# Patient Record
Sex: Female | Born: 1960 | Race: White | Hispanic: No | Smoking: Former smoker
Health system: Southern US, Community
[De-identification: ages and names within clinical notes are randomized; demographics above are authoritative.]

## PROBLEM LIST (undated history)

## (undated) DIAGNOSIS — E785 Hyperlipidemia, unspecified: Secondary | ICD-10-CM

## (undated) DIAGNOSIS — Z1589 Genetic susceptibility to other disease: Secondary | ICD-10-CM

## (undated) DIAGNOSIS — F419 Anxiety disorder, unspecified: Secondary | ICD-10-CM

## (undated) DIAGNOSIS — Z1509 Genetic susceptibility to other malignant neoplasm: Secondary | ICD-10-CM

## (undated) DIAGNOSIS — Z1501 Genetic susceptibility to malignant neoplasm of breast: Secondary | ICD-10-CM

## (undated) DIAGNOSIS — N3010 Interstitial cystitis (chronic) without hematuria: Secondary | ICD-10-CM

## (undated) DIAGNOSIS — M8580 Other specified disorders of bone density and structure, unspecified site: Secondary | ICD-10-CM

## (undated) DIAGNOSIS — Z8041 Family history of malignant neoplasm of ovary: Secondary | ICD-10-CM

## (undated) DIAGNOSIS — A6000 Herpesviral infection of urogenital system, unspecified: Secondary | ICD-10-CM

## (undated) DIAGNOSIS — R87629 Unspecified abnormal cytological findings in specimens from vagina: Secondary | ICD-10-CM

## (undated) HISTORY — DX: Genetic susceptibility to other disease: Z15.89

## (undated) HISTORY — DX: Anxiety disorder, unspecified: F41.9

## (undated) HISTORY — DX: Genetic susceptibility to malignant neoplasm of breast: Z15.09

## (undated) HISTORY — PX: CARPAL TUNNEL RELEASE: SHX101

## (undated) HISTORY — DX: Genetic susceptibility to malignant neoplasm of breast: Z15.01

## (undated) HISTORY — DX: Family history of malignant neoplasm of ovary: Z80.41

## (undated) HISTORY — DX: Interstitial cystitis (chronic) without hematuria: N30.10

## (undated) HISTORY — DX: Hyperlipidemia, unspecified: E78.5

## (undated) HISTORY — PX: MASTECTOMY: SHX3

## (undated) HISTORY — PX: CRYOTHERAPY: SHX1416

## (undated) HISTORY — DX: Unspecified abnormal cytological findings in specimens from vagina: R87.629

## (undated) HISTORY — DX: Other specified disorders of bone density and structure, unspecified site: M85.80

## (undated) HISTORY — DX: Herpesviral infection of urogenital system, unspecified: A60.00

---

## 1993-05-14 HISTORY — PX: DILATION AND CURETTAGE, DIAGNOSTIC / THERAPEUTIC: SUR384

## 1998-05-14 HISTORY — PX: TUBAL LIGATION: SHX77

## 2001-02-27 HISTORY — PX: ABDOMINAL HYSTERECTOMY: SHX81

## 2005-05-13 ENCOUNTER — Emergency Department (HOSPITAL_COMMUNITY): Admission: EM | Admit: 2005-05-13 | Discharge: 2005-05-14 | Payer: Self-pay | Admitting: Emergency Medicine

## 2005-07-06 ENCOUNTER — Ambulatory Visit (HOSPITAL_COMMUNITY): Admission: RE | Admit: 2005-07-06 | Discharge: 2005-07-06 | Payer: Self-pay | Admitting: Family Medicine

## 2008-11-11 DIAGNOSIS — R87629 Unspecified abnormal cytological findings in specimens from vagina: Secondary | ICD-10-CM

## 2008-11-11 HISTORY — DX: Unspecified abnormal cytological findings in specimens from vagina: R87.629

## 2009-06-11 ENCOUNTER — Ambulatory Visit: Payer: Self-pay | Admitting: Gynecologic Oncology

## 2009-07-07 ENCOUNTER — Ambulatory Visit: Payer: Self-pay | Admitting: Gynecologic Oncology

## 2009-07-12 ENCOUNTER — Ambulatory Visit: Payer: Self-pay | Admitting: Gynecologic Oncology

## 2009-07-20 ENCOUNTER — Ambulatory Visit: Payer: Self-pay | Admitting: Gynecologic Oncology

## 2009-08-12 ENCOUNTER — Ambulatory Visit: Payer: Self-pay | Admitting: Gynecologic Oncology

## 2011-03-18 ENCOUNTER — Emergency Department: Payer: Self-pay | Admitting: Emergency Medicine

## 2012-12-12 HISTORY — PX: LAPAROSCOPIC OOPHERECTOMY: SHX6507

## 2013-02-20 ENCOUNTER — Ambulatory Visit: Payer: Self-pay | Admitting: Obstetrics & Gynecology

## 2013-02-20 LAB — CBC
HGB: 13.1 g/dL (ref 12.0–16.0)
RBC: 3.85 10*6/uL (ref 3.80–5.20)

## 2013-03-05 ENCOUNTER — Ambulatory Visit: Payer: Self-pay | Admitting: Obstetrics & Gynecology

## 2013-03-25 ENCOUNTER — Ambulatory Visit: Payer: Self-pay | Admitting: Oncology

## 2013-03-28 ENCOUNTER — Other Ambulatory Visit: Payer: Self-pay | Admitting: Oncology

## 2013-03-28 DIAGNOSIS — Z1501 Genetic susceptibility to malignant neoplasm of breast: Secondary | ICD-10-CM

## 2013-04-11 ENCOUNTER — Ambulatory Visit: Payer: Self-pay | Admitting: Oncology

## 2013-04-22 ENCOUNTER — Other Ambulatory Visit: Payer: Self-pay

## 2013-04-29 ENCOUNTER — Ambulatory Visit
Admission: RE | Admit: 2013-04-29 | Discharge: 2013-04-29 | Disposition: A | Discharge status: Home / Self Care | Payer: Managed Care, Other (non HMO) | Source: Ambulatory Visit | Attending: Oncology | Admitting: Oncology

## 2013-04-29 DIAGNOSIS — Z1501 Genetic susceptibility to malignant neoplasm of breast: Secondary | ICD-10-CM

## 2013-04-29 DIAGNOSIS — Z1509 Genetic susceptibility to other malignant neoplasm: Secondary | ICD-10-CM

## 2013-04-29 MED ORDER — GADOBENATE DIMEGLUMINE 529 MG/ML IV SOLN
11.0000 mL | Freq: Once | INTRAVENOUS | Status: AC | PRN
  Administered 2013-04-29: 11 mL via INTRAVENOUS

## 2013-06-11 ENCOUNTER — Ambulatory Visit: Payer: Self-pay | Admitting: Oncology

## 2014-09-19 ENCOUNTER — Other Ambulatory Visit: Payer: Self-pay

## 2014-09-19 DIAGNOSIS — Z1231 Encounter for screening mammogram for malignant neoplasm of breast: Secondary | ICD-10-CM

## 2014-09-23 ENCOUNTER — Ambulatory Visit
Admission: RE | Admit: 2014-09-23 | Discharge: 2014-09-23 | Disposition: A | Discharge status: Home / Self Care | Payer: No Typology Code available for payment source | Source: Ambulatory Visit

## 2014-09-23 DIAGNOSIS — Z1231 Encounter for screening mammogram for malignant neoplasm of breast: Secondary | ICD-10-CM

## 2014-09-25 ENCOUNTER — Other Ambulatory Visit: Payer: Self-pay | Admitting: Unknown Physician Specialty

## 2014-09-25 DIAGNOSIS — R928 Other abnormal and inconclusive findings on diagnostic imaging of breast: Secondary | ICD-10-CM

## 2014-10-01 ENCOUNTER — Ambulatory Visit
Admission: RE | Admit: 2014-10-01 | Discharge: 2014-10-01 | Disposition: A | Discharge status: Home / Self Care | Payer: No Typology Code available for payment source | Source: Ambulatory Visit | Attending: Unknown Physician Specialty | Admitting: Unknown Physician Specialty

## 2014-10-01 DIAGNOSIS — R928 Other abnormal and inconclusive findings on diagnostic imaging of breast: Secondary | ICD-10-CM

## 2015-04-03 NOTE — Op Note (Signed)
PATIENT NAME:  Candice Henderson, Candice Henderson MR#:  [id number] DATE OF BIRTH:  Oct 18, 1961  DATE OF PROCEDURE:  03/05/2013  PREOPERATIVE DIAGNOSIS: Family history for breast and ovarian cancer, BRCA positive status.  POSTOPERATIVE DIAGNOSIS: Family history for breast and ovarian cancer, BRCA positive status.   PROCEDURE PERFORMED: Operative laparoscopy and right salpingo-oophorectomy.   SURGEON: Barnett Applebaum, MD.   ANESTHESIA: General.   ESTIMATED BLOOD LOSS: Minimal.   COMPLICATIONS: None.   FINDINGS: Atrophic right tube and ovary clearly visualized. There were adhesions of the omentum and colon to the left lower quadrant, but none to the port entry sites or the right adnexa.   DISPOSITION: To the recovery room in stable condition.   TECHNIQUE: The patient is prepped and draped in the usual sterile fashion after adequate anesthesia is obtained in the dorsal lithotomy position. Bladder is drained with a Robinson catheter.   Attention was turned to the abdomen, where a Veress needle inserted through a 5 mm infraumbilical incision after Marcaine is used to anesthetize the skin. Veress needle placement is confirmed using the hanging drop technique and the abdomen is then insufflated with CO2 gas. A 5 mm trocar is then inserted under direct visualization with the laparoscope with no injuries or bleeding noted. The patient is placed in Trendelenburg position and the above-mentioned findings are visualized. A 5 mm trocar is placed in the left lower quadrant lateral to the inferior epigastric blood vessels with no injuries or bleeding noted. An 11 mm trocar is placed in the right lower quadrant lateral to the inferior epigastric blood vessels with no injuries or bleeding noted. Adhesions are dissected in the midline anterior abdominal wall towards the bladder. They are observed in the left lower quadrant, but are not dissected in this area as they are not causing any harm. The right tube and ovary is clearly  visualized. The ureter is also visualized out of harms way. The infundibulopelvic blood vessels and ligaments are carefully coagulated and cut using the 5 mm Harmonic scalpel device and then the ovaries then carefully dissected free off of the sidewall along with the fallopian tube. There are no injuries or bleeding noted. Ureters are intact. Bladder is intact. Bowel is intact. An Endopouch is placed through the trocar and the ovary and tube was placed within the Endopouch and removed easily. No bleeding was noted within the pelvis. Gas is expelled. The patient is leveled. Trocars are removed. The skin is closed with Dermabond. The patient goes to the recovery room in stable condition. All sponge, instrument, and needle counts are correct.   ____________________________ R. Barnett Applebaum, MD rph:aw D: 03/05/2013 12:31:58 ET T: 03/05/2013 12:43:51 ET JOB#: [id number]  cc: Glean Salen, MD, <Dictator> Gae Dry MD ELECTRONICALLY SIGNED 03/05/2013 14:07

## 2015-04-03 NOTE — Consult Note (Signed)
 Note Type Consult   HPI: Referred by Dr. Harris.   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 HPI:   Patient is a 54-year-old female with no significant past medical history that was found to be BRCA2 positive putting her at high risk for breast cancer.  She has had a total hysterectomy.  Currently, she feels well and is asymptomatic.  She denies any recent fevers or illnesses.  She has a good appetite and denies weight loss.  Recent mammogram in January was reported as normal.  Patient feels at her baseline and offers no specific complaints today.   Review of Systems:  Performance Status (ECOG): 0  Review of Systems:   As per HPI. Otherwise, 10 point system review was negative.   Allergies:  Codeine: Dizzy/Fainting, Rash, N/V/Diarrhea  Macrobid: Rash  Preventive Screening:  Has patient had any of the following test? Mammography  Pap Smear (1)   Last Mammography: 01/2013(1)   Last Pap Smear: 09/2012(1)   Smoking History: Smoking History 1/2(1)Packs per day(1)  PFSH: Additional Past Medical and Surgical History: total hysterectomy.    Family history: Paternal aunt with breast cancer,  maternal grandmother with ovarian cancer.   Paternal grandfather with prostate cancer.    Social history: Tobacco as above, denies alcohol.   Home Medications: Medication Instructions Last Modified Date/Time  tamoxifen 20 mg oral tablet 1 tab(s) orally once a day x 90 days 17-Apr-14 10:03  clonazepam 0.5 mg oral tablet 1 tab(s) orally 2 times a day 17-Apr-14 09:22  citalopram 20 mg oral tablet 1 tab(s) orally once a day (at bedtime) 17-Apr-14 09:22  pravastatin 20 mg oral tablet 1 tab(s) orally once a day (at bedtime) 17-Apr-14 09:22  Percocet 5/325 325 mg-5 mg oral tablet 1 tab(s) orally every 4-6 hours as needed for pain 17-Apr-14 09:22  estradiol 1 mg oral  tablet 1 tab(s) orally once a day 17-Apr-14 09:22   Vital Signs:  :: Ht(CM): 167 Wt(KG): 57.5 BSA: 1.6 Temp: 98.1 Pulse: 60 RR: 16  BP: 101/65   Physical Exam:  General: well developed, well nourished, and in no acute distress  Mental Status: normal affect  Eyes: anicteric sclera  Skin: No rash or petechiae noted  Neurological: alert, answering all questions appropriately.  Cranial nerves grossly intact   Assessment and Plan: Impression:   BRCA 2 positive Plan:   1.  BRCA 2 positive:  After lengthy discussion with the patient, she was informed her that she has an 84% chance of developing breast cancer over the next 20 years.  She has declined any surgical intervention which would include bilateral mastectomy with immediate reconstruction.  She also declined consultation with surgery to further discuss the details. She wishes to proceed with tamoxifen prophylaxis and yearly breast MRIs.  Patient was given a prescription for 20 mg tamoxifen with refills to last for one year, at that point either her primary care or OB/GYN should continue refills for a total of 5 years. There is no evidence that continuing tamoxifen beyond 5 years offers any benefit.  The breast MRI was also ordered and this will be required yearly along with yearly mammograms as well as breast examination every 6 months.  Patient expressed understanding and was in agreement with this plan. 60 minutes was spent in discussion and consultation.  CC Referral:  cc: Dr. Van Dalen, Dr. Angel Brown.     Electronic Signatures: Delight Hoh (MD)  (Signed 18-Apr-14 10:13)  Authored: Note Type, History of Present Illness, CC/HPI, Review of Systems, ALLERGIES, Preventive Screening, Smoking Cessation, Patient Family Social History, HOME MEDICATIONS, Vital Signs, Physical Exam, Assessment and Plan, CC Referring Physician   Last Updated: 18-Apr-14 10:13 by Delight Hoh (MD)  References: 1.  Data Referenced From "[facility]  Office Nurse Note" 28-Mar-2013 9:16 AM

## 2015-08-13 HISTORY — PX: COLONOSCOPY: SHX174

## 2015-09-21 ENCOUNTER — Encounter: Payer: Self-pay | Admitting: *Deleted

## 2015-10-05 ENCOUNTER — Encounter: Payer: Self-pay | Admitting: General Surgery

## 2015-10-05 ENCOUNTER — Ambulatory Visit (INDEPENDENT_AMBULATORY_CARE_PROVIDER_SITE_OTHER): Payer: 59 | Admitting: General Surgery

## 2015-10-05 VITALS — BP 102/70 | HR 68 | Resp 13 | Ht 64.5 in | Wt 132.0 lb

## 2015-10-05 DIAGNOSIS — Z1509 Genetic susceptibility to other malignant neoplasm: Principal | ICD-10-CM

## 2015-10-05 DIAGNOSIS — Z1501 Genetic susceptibility to malignant neoplasm of breast: Secondary | ICD-10-CM | POA: Diagnosis not present

## 2015-10-05 NOTE — Progress Notes (Signed)
Patient ID: Candice Henderson, female   DOB: 11-20-1961, 54 y.o.   MRN: [id number]  Chief Complaint  Patient presents with  . BRCA positive    HPI Candice Henderson is a 54 y.o. female here for surgical evaluation for BRCA 2 positive. She has a strong family history of ovarian cancer prompting testing.  She has a history of cervical dysplasia. She had BRCA testing done about 3 years ago.   She reports no breast problems. Her most recent mammogram was done on 09/18/15 at [facility]. She had a breast MRI on 04/29/13.  The patient had previously undergone a hysterectomy and unilateral ovary removal. With the identification of the BRCA-2 abnormality, she underwent removal of the remaining ovary.  The patient has been on tamoxifen therapy since discovery of the BRCA-2 gene expression, and tolerated it well.  She has a twin (fraternal) sister with breast cancer that was diagnosed last year and was also BRCA positive. This individual was treated with high lateral mastectomy and underwent immediate reconstruction. Apparently this was a fairly traumatic procedure for the patient as well as for her sister.  She reports another friend to it undergone cosmetic breast augmentation had some difficulties with the procedure and at this time she is very ambivalent about whether she would undergo any reconstructive procedure should she have prophylactic mastectomies completed.  I personally confirmed the above history during the patient's visit. Marland Kitchen HPI  Past Medical History  Diagnosis Date  . Hyperlipidemia   . BRCA2 positive     Past Surgical History  Procedure Laterality Date  . Cesarean section    . Carpal tunnel release  15 years ago    Thumb joint reconstruction  . Tubal ligation    . Abdominal hysterectomy    . Laparoscopic oopherectomy      Family History  Problem Relation Age of Onset  . Breast cancer Sister     stage 2, triple neg, BRCA pos  . Lung cancer Father   . Liver cancer Mother      Social History Social History  Substance Use Topics  . Smoking status: Current Every Day Smoker -- 0.25 packs/day for 30 years    Types: Cigarettes  . Smokeless tobacco: Never Used  . Alcohol Use: 0.0 oz/week    0 Standard drinks or equivalent per week    Allergies  Allergen Reactions  . Codeine Nausea And Vomiting and Rash  . Macrobid [Nitrofurantoin] Nausea And Vomiting and Rash    Current Outpatient Prescriptions  Medication Sig Dispense Refill  . citalopram (CELEXA) 20 MG tablet TK 1 T PO  DAILY  1  . clonazePAM (KLONOPIN) 1 MG tablet TK 1 T PO BID PRA  0  . pravastatin (PRAVACHOL) 40 MG tablet TK 1 T PO Q EVENING  1  . tamoxifen (NOLVADEX) 20 MG tablet   1   No current facility-administered medications for this visit.    Review of Systems Review of Systems  Constitutional: Negative.   Respiratory: Negative.   Cardiovascular: Negative.     Blood pressure 102/70, pulse 68, resp. rate 13, height 5' 4.5" (1.638 m), weight 132 lb (59.875 kg).  Physical Exam Physical Exam  Constitutional: She is oriented to person, place, and time. She appears well-developed and well-nourished.  Eyes: Conjunctivae are normal. No scleral icterus.  Neck: Neck supple.  Cardiovascular: Normal rate, regular rhythm and normal heart sounds.   Pulmonary/Chest: Effort normal and breath sounds normal. Right breast exhibits no inverted nipple, no mass,  no nipple discharge, no skin change and no tenderness. Left breast exhibits no inverted nipple, no mass, no nipple discharge, no skin change and no tenderness.  Lymphadenopathy:    She has no cervical adenopathy.    She has no axillary adenopathy.  Neurological: She is alert and oriented to person, place, and time.  Skin: Skin is warm and dry.  Psychiatric: She has a normal mood and affect.    Data Reviewed Screening mammogram dated 09/17/2005 was available only on written report. Heterogeneously dense breast, no interval change.  BI-RADS-1.  The patient reports bone density testing completed fall 2016 showed osteopenia of the hip and spine. These results were not available for review.  BRCA analysis dated 08/29/2012 showed normal BRCA1 sequencing, abnormal BRCA2 sequencing, the patient was shown to have a mutation in the [id number], with a possible increased risk of breast cancer 84%.  Office notes from Bunnie Pion, dated 09/18/2015 were reviewed. Dr. Ammie Dalton made note that the patient would definitely not consider reconstruction.  Office notes from Zara Chess, NP dated 08/25/2015 were reviewed. Laboratory studies obtained during that visit showed a hemoglobin of 12.6 with an MCV of 82, platelet count of 165,000. Normal electrolytes. Random blood sugar elevated at 105. Creatinine 0.7. Estimated GFR greater than 60. Low-normal vitamin D. Osteopenia noted with a T score of -1.3. Study dated 08/27/2015.  Chest x-ray dated 03/31/2015 for chest wall pain was negative.  Assessment    Healthy woman with BRCA-2 abnormality.  Family history of breast cancer.    Plan    The patient has tolerated tamoxifen fairly well, but hot flashes/vasomotor symptoms are noted in her PCP records.  Her risk for breast cancer is likely between 50-80% based on her family history and BRCA status.  The less than ideal results/experience with breast reconstruction from both her sister reconstructed after cancer and a friend who had cosmetic augmentation have discouraged her from consideration of reconstruction after prophylactic mastectomy.  We spent about 15 minutes reviewing pros and cons of various surgical approaches. I have recommended that a MRI be completed prior to prophylactic mastectomy. If a malignancy was identified this would allow sentinel node biopsy at the time of mastectomy.    Patient to have a bilateral breast MRI at the [facility] [place].  Once the MRI results are available arrangements can be made  to discuss elective surgery.  PCP: Rosalie Gums 10/07/2015, 6:14 PM

## 2015-10-05 NOTE — Patient Instructions (Signed)
Patient to call and arrange for a bilateral breast MRI [facility].

## 2015-10-07 DIAGNOSIS — Z1501 Genetic susceptibility to malignant neoplasm of breast: Secondary | ICD-10-CM | POA: Insufficient documentation

## 2015-10-07 DIAGNOSIS — Z1509 Genetic susceptibility to other malignant neoplasm: Principal | ICD-10-CM

## 2015-10-20 ENCOUNTER — Ambulatory Visit
Admission: RE | Admit: 2015-10-20 | Discharge: 2015-10-20 | Disposition: A | Discharge status: Home / Self Care | Payer: 59 | Source: Ambulatory Visit | Attending: General Surgery | Admitting: General Surgery

## 2015-10-20 ENCOUNTER — Telehealth: Payer: Self-pay | Admitting: General Surgery

## 2015-10-20 DIAGNOSIS — Z1509 Genetic susceptibility to other malignant neoplasm: Principal | ICD-10-CM

## 2015-10-20 DIAGNOSIS — Z1501 Genetic susceptibility to malignant neoplasm of breast: Secondary | ICD-10-CM

## 2015-10-20 MED ORDER — GADOBENATE DIMEGLUMINE 529 MG/ML IV SOLN
12.0000 mL | Freq: Once | INTRAVENOUS | Status: AC | PRN
  Administered 2015-10-20: 12 mL via INTRAVENOUS

## 2015-10-20 NOTE — Telephone Encounter (Signed)
The patient was notified that the MRI completed earlier in the day did not show any areas suspicious for malignancy.  She is leaning towards prophylactic mastectomy, but is aware at this time there is nothing forcing the decision.  The patient will consider her options (ongoing tamoxifen, risks associated with DVT/smoking) versus prophylactic mastectomy and notify this office of how she would like to proceed.

## 2015-10-23 ENCOUNTER — Telehealth: Payer: Self-pay | Admitting: General Surgery

## 2015-10-23 NOTE — Telephone Encounter (Signed)
PT CALLED & WOULD LIKE TO TALK WITH YOU FURTHER ABOUT HAVING A BILATERAL MASTECTOMY. SHE WOULD LIKE TO HAVE SURGERY BEFORE THE END OF YEAR DUE TO INSURANCE.

## 2015-10-23 NOTE — Telephone Encounter (Signed)
Candice Henderson, I SPOKE WITH PT & HAVE SCH'D HER APPT WITH DR BYRNETT FOR SX DISCUSSION ON 11-04-15 @ 11:45AM. HOWEVER,I WAS UNABLE TO BLOCK 45/60 MINS OFF. THE  PT DOESN'T WANT TO COME IN THE PM FOR APPT. IF I NEED TO RE-SCH HER APPT  PLEASE LET ME KNOW.

## 2015-10-23 NOTE — Telephone Encounter (Signed)
Arrange f/u appt to discuss. Candice Henderson will contact her Monday to look at dates.

## 2015-10-26 ENCOUNTER — Other Ambulatory Visit: Payer: Self-pay | Admitting: General Surgery

## 2015-10-26 DIAGNOSIS — Z1509 Genetic susceptibility to other malignant neoplasm: Principal | ICD-10-CM

## 2015-10-26 DIAGNOSIS — Z1501 Genetic susceptibility to malignant neoplasm of breast: Secondary | ICD-10-CM

## 2015-10-26 NOTE — Telephone Encounter (Signed)
Spoke with patient and she would like to set her surgery up for 11/20/15. She will be coming into the office on 11/04/15 at 11:45 am to discuss this with Dr Lemar LivingsByrnett.

## 2015-11-04 ENCOUNTER — Encounter: Payer: Self-pay | Admitting: General Surgery

## 2015-11-04 ENCOUNTER — Ambulatory Visit (INDEPENDENT_AMBULATORY_CARE_PROVIDER_SITE_OTHER): Payer: 59 | Admitting: General Surgery

## 2015-11-04 VITALS — BP 94/62 | HR 82 | Resp 14 | Ht 64.5 in | Wt 133.0 lb

## 2015-11-04 DIAGNOSIS — Z1501 Genetic susceptibility to malignant neoplasm of breast: Secondary | ICD-10-CM

## 2015-11-04 DIAGNOSIS — Z1509 Genetic susceptibility to other malignant neoplasm: Principal | ICD-10-CM

## 2015-11-04 NOTE — Progress Notes (Signed)
Patient ID: Candice Henderson, female   DOB: [birth year], 54 y.o.   MRN: [id number]  Chief Complaint  Patient presents with  . Other    Discussion for surgery    HPI Candice Henderson is a 54 y.o. female here today for a pre-op discussion for her surgery, prophylactic bilateral mastectomy, scheduled for 11/20/15.   HPI  Past Medical History  Diagnosis Date  . Hyperlipidemia   . BRCA2 positive     Past Surgical History  Procedure Laterality Date  . Cesarean section    . Carpal tunnel release  15 years ago    Thumb joint reconstruction  . Tubal ligation    . Abdominal hysterectomy    . Laparoscopic oopherectomy      Family History  Problem Relation Age of Onset  . Breast cancer Sister     stage 2, triple neg, BRCA pos  . Lung cancer Father   . Liver cancer Mother     Social History Social History  Substance Use Topics  . Smoking status: Former Smoker -- 0.25 packs/day for 30 years    Types: Cigarettes    Quit date: 10/26/2015  . Smokeless tobacco: Never Used  . Alcohol Use: 0.0 oz/week    0 Standard drinks or equivalent per week    Allergies  Allergen Reactions  . Codeine Nausea And Vomiting and Rash  . Macrobid [Nitrofurantoin] Nausea And Vomiting and Rash    Current Outpatient Prescriptions  Medication Sig Dispense Refill  . citalopram (CELEXA) 20 MG tablet TK 1 T PO  DAILY  1  . clonazePAM (KLONOPIN) 1 MG tablet TK 1 T PO BID PRA  0  . pravastatin (PRAVACHOL) 40 MG tablet TK 1 T PO Q EVENING  1   No current facility-administered medications for this visit.    Review of Systems Review of Systems  Constitutional: Negative.   Respiratory: Negative.   Cardiovascular: Negative.     Blood pressure 94/62, pulse 82, resp. rate 14, height 5' 4.5" (1.638 m), weight 133 lb (60.328 kg).  Physical Exam Physical Exam  Constitutional: She is oriented to person, place, and time. She appears well-developed and well-nourished.  HENT:  Mouth/Throat: Oropharynx is  clear and moist.  Eyes: Conjunctivae are normal. No scleral icterus.  Neck: Neck supple.  Cardiovascular: Normal rate, regular rhythm and normal heart sounds.   Pulmonary/Chest: Effort normal and breath sounds normal.  Lymphadenopathy:    She has no cervical adenopathy.  Neurological: She is alert and oriented to person, place, and time.  Skin: Skin is warm and dry.  Psychiatric: Her behavior is normal.      Assessment    BRCA-2 positive, desires prophylactic bilateral mastectomy.    Plan    We went over the pros and cons of mastectomy with or without immediate reconstruction. She is wavered considerably on reconstruction, in part based on her sister's traumas, especially pain with the expander. I emphasized that if she has any desire for immediate reconstruction, now is the time to meet with plastics, she declines.  Drains, activity restrictions and return to full activity reviewed.    Patient's surgery has been scheduled for 11-20-15 at [facility].  PCP: Rosalie Gums 11/05/2015, 7:05 PM

## 2015-11-04 NOTE — Patient Instructions (Addendum)
The patient is aware to call back for any questions or concerns.  Patient's surgery has been scheduled for 11-20-15 at [facility]. 

## 2015-11-12 ENCOUNTER — Other Ambulatory Visit: Payer: 59

## 2015-11-12 ENCOUNTER — Encounter: Payer: Self-pay | Admitting: *Deleted

## 2015-11-12 NOTE — Patient Instructions (Signed)
  Your procedure is scheduled on: 11-20-15 Report to [facility] 2ND FLOOR To find out your arrival time please call [phone] between 1PM - 3PM on 11-19-15  Remember: Instructions that are not followed completely may result in serious medical risk, up to and including death, or upon the discretion of your surgeon and anesthesiologist your surgery may need to be rescheduled.    _X___ 1. Do not eat food or drink liquids after midnight. No gum chewing or hard candies.     _X___ 2. No Alcohol for 24 hours before or after surgery.   ____ 3. Bring all medications with you on the day of surgery if instructed.    ____ 4. Notify your doctor if there is any change in your medical condition     (cold, fever, infections).     Do not wear jewelry, make-up, hairpins, clips or nail polish.  Do not wear lotions, powders, or perfumes. You may wear deodorant.  Do not shave 48 hours prior to surgery. Men may shave face and neck.  Do not bring valuables to the hospital.    [facility] is not responsible for any belongings or valuables.               Contacts, dentures or bridgework may not be worn into surgery.  Leave your suitcase in the car. After surgery it may be brought to your room.  For patients admitted to the hospital, discharge time is determined by your                treatment team.   Patients discharged the day of surgery will not be allowed to drive home.   Please read over the following fact sheets that you were given:      _X___ Take these medicines the morning of surgery with A SIP OF WATER:    1. KLONONPIN  2. CITALOPRAM  3.   4.  5.  6.  ____ Fleet Enema (as directed)   ____ Use CHG Soap as directed  ____ Use inhalers on the day of surgery  ____ Stop metformin 2 days prior to surgery    ____ Take 1/2 of usual insulin dose the night before surgery and none on the morning of surgery.   ____ Stop Coumadin/Plavix/aspirin-N/A  ____ Stop  Anti-inflammatories   ____ Stop supplements until after surgery.    ____ Bring C-Pap to the hospital.

## 2015-11-19 MED ORDER — IPRATROPIUM-ALBUTEROL 0.5-2.5 (3) MG/3ML IN SOLN
RESPIRATORY_TRACT | Status: AC
  Filled 2015-11-19: qty 3

## 2015-11-19 MED ORDER — ALBUTEROL SULFATE (2.5 MG/3ML) 0.083% IN NEBU
INHALATION_SOLUTION | RESPIRATORY_TRACT | Status: AC
  Filled 2015-11-19: qty 3

## 2015-11-20 ENCOUNTER — Ambulatory Visit: Payer: 59 | Admitting: Anesthesiology

## 2015-11-20 ENCOUNTER — Encounter
Admission: RE | Disposition: A | Discharge status: Home / Self Care | Payer: Self-pay | Source: Ambulatory Visit | Attending: General Surgery

## 2015-11-20 ENCOUNTER — Ambulatory Visit
Admission: RE | Admit: 2015-11-20 | Discharge: 2015-11-20 | Disposition: A | Discharge status: Home / Self Care | Payer: 59 | Source: Ambulatory Visit | Attending: General Surgery | Admitting: General Surgery

## 2015-11-20 DIAGNOSIS — Z90710 Acquired absence of both cervix and uterus: Secondary | ICD-10-CM | POA: Insufficient documentation

## 2015-11-20 DIAGNOSIS — Z803 Family history of malignant neoplasm of breast: Secondary | ICD-10-CM | POA: Diagnosis not present

## 2015-11-20 DIAGNOSIS — Z9889 Other specified postprocedural states: Secondary | ICD-10-CM | POA: Diagnosis not present

## 2015-11-20 DIAGNOSIS — Z4001 Encounter for prophylactic removal of breast: Secondary | ICD-10-CM | POA: Diagnosis not present

## 2015-11-20 DIAGNOSIS — Z87891 Personal history of nicotine dependence: Secondary | ICD-10-CM | POA: Insufficient documentation

## 2015-11-20 DIAGNOSIS — Z885 Allergy status to narcotic agent status: Secondary | ICD-10-CM | POA: Insufficient documentation

## 2015-11-20 DIAGNOSIS — Z1501 Genetic susceptibility to malignant neoplasm of breast: Secondary | ICD-10-CM

## 2015-11-20 DIAGNOSIS — E785 Hyperlipidemia, unspecified: Secondary | ICD-10-CM | POA: Insufficient documentation

## 2015-11-20 DIAGNOSIS — Z881 Allergy status to other antibiotic agents status: Secondary | ICD-10-CM | POA: Diagnosis not present

## 2015-11-20 DIAGNOSIS — Z1509 Genetic susceptibility to other malignant neoplasm: Secondary | ICD-10-CM

## 2015-11-20 DIAGNOSIS — Z888 Allergy status to other drugs, medicaments and biological substances status: Secondary | ICD-10-CM | POA: Diagnosis not present

## 2015-11-20 HISTORY — PX: BREAST SURGERY: SHX581

## 2015-11-20 HISTORY — PX: SIMPLE MASTECTOMY WITH AXILLARY SENTINEL NODE BIOPSY: SHX6098

## 2015-11-20 SURGERY — SIMPLE MASTECTOMY
Anesthesia: General | Laterality: Bilateral | Wound class: Clean

## 2015-11-20 MED ORDER — ACETAMINOPHEN 10 MG/ML IV SOLN
INTRAVENOUS | Status: DC | PRN
Start: 2015-11-20 — End: 2015-11-20
  Administered 2015-11-20: 1000 mg via INTRAVENOUS

## 2015-11-20 MED ORDER — LIDOCAINE HCL (CARDIAC) 20 MG/ML IV SOLN
INTRAVENOUS | Status: DC | PRN
  Administered 2015-11-20: 80 mg via INTRAVENOUS

## 2015-11-20 MED ORDER — HYDROMORPHONE HCL 1 MG/ML IJ SOLN
INTRAMUSCULAR | Status: AC
  Administered 2015-11-20: 0.25 mg via INTRAVENOUS
  Filled 2015-11-20: qty 1

## 2015-11-20 MED ORDER — SODIUM CHLORIDE 0.9 % IJ SOLN
INTRAMUSCULAR | Status: AC
  Filled 2015-11-20: qty 10

## 2015-11-20 MED ORDER — FAMOTIDINE 20 MG PO TABS
ORAL_TABLET | ORAL | Status: AC
  Administered 2015-11-20: 20 mg
  Filled 2015-11-20: qty 1

## 2015-11-20 MED ORDER — OXYCODONE-ACETAMINOPHEN 5-325 MG PO TABS
1.0000 | ORAL_TABLET | ORAL | Status: DC | PRN
  Administered 2015-11-20: 1 via ORAL

## 2015-11-20 MED ORDER — GLYCOPYRROLATE 0.2 MG/ML IJ SOLN
INTRAMUSCULAR | Status: DC | PRN
  Administered 2015-11-20: 0.2 mg via INTRAVENOUS

## 2015-11-20 MED ORDER — CEFAZOLIN SODIUM 1-5 GM-% IV SOLN
INTRAVENOUS | Status: DC | PRN
  Administered 2015-11-20: 2 g via INTRAVENOUS

## 2015-11-20 MED ORDER — FAMOTIDINE 20 MG PO TABS: 20.0000 mg | ORAL_TABLET | Freq: Once | ORAL | Status: DC

## 2015-11-20 MED ORDER — CEFAZOLIN SODIUM-DEXTROSE 2-3 GM-% IV SOLR: 2.0000 g | INTRAVENOUS | Status: DC

## 2015-11-20 MED ORDER — OXYCODONE-ACETAMINOPHEN 5-325 MG PO TABS: 1.0000 | ORAL_TABLET | ORAL | Status: DC | PRN

## 2015-11-20 MED ORDER — EPHEDRINE SULFATE 50 MG/ML IJ SOLN
INTRAMUSCULAR | Status: DC | PRN
  Administered 2015-11-20: 10 mg via INTRAVENOUS

## 2015-11-20 MED ORDER — KETOROLAC TROMETHAMINE 30 MG/ML IJ SOLN
INTRAMUSCULAR | Status: DC | PRN
  Administered 2015-11-20: 30 mg via INTRAVENOUS

## 2015-11-20 MED ORDER — FENTANYL CITRATE (PF) 100 MCG/2ML IJ SOLN
INTRAMUSCULAR | Status: AC
  Administered 2015-11-20: 25 ug via INTRAVENOUS
  Filled 2015-11-20: qty 2

## 2015-11-20 MED ORDER — METHYLENE BLUE 1 % INJ SOLN
INTRAMUSCULAR | Status: AC
  Filled 2015-11-20: qty 10

## 2015-11-20 MED ORDER — DEXAMETHASONE SODIUM PHOSPHATE 4 MG/ML IJ SOLN
INTRAMUSCULAR | Status: DC | PRN
  Administered 2015-11-20: 10 mg via INTRAVENOUS

## 2015-11-20 MED ORDER — CEFAZOLIN SODIUM-DEXTROSE 2-3 GM-% IV SOLR
INTRAVENOUS | Status: AC
  Filled 2015-11-20: qty 50

## 2015-11-20 MED ORDER — ACETAMINOPHEN 10 MG/ML IV SOLN
INTRAVENOUS | Status: AC
  Filled 2015-11-20: qty 100

## 2015-11-20 MED ORDER — LACTATED RINGERS IV SOLN
INTRAVENOUS | Status: DC
  Administered 2015-11-20 (×2): via INTRAVENOUS

## 2015-11-20 MED ORDER — OXYCODONE-ACETAMINOPHEN 5-325 MG PO TABS
ORAL_TABLET | ORAL | Status: AC
  Administered 2015-11-20: 1 via ORAL
  Filled 2015-11-20: qty 1

## 2015-11-20 MED ORDER — MIDAZOLAM HCL 2 MG/2ML IJ SOLN
INTRAMUSCULAR | Status: DC | PRN
  Administered 2015-11-20: 2 mg via INTRAVENOUS

## 2015-11-20 MED ORDER — FENTANYL CITRATE (PF) 100 MCG/2ML IJ SOLN
25.0000 ug | INTRAMUSCULAR | Status: DC | PRN
  Administered 2015-11-20 (×4): 25 ug via INTRAVENOUS

## 2015-11-20 MED ORDER — PROPOFOL 10 MG/ML IV BOLUS
INTRAVENOUS | Status: DC | PRN
  Administered 2015-11-20: 450 mg via INTRAVENOUS

## 2015-11-20 MED ORDER — ONDANSETRON HCL 4 MG/2ML IJ SOLN: 4.0000 mg | Freq: Once | INTRAMUSCULAR | Status: DC | PRN

## 2015-11-20 MED ORDER — LIDOCAINE HCL (PF) 1 % IJ SOLN
INTRAMUSCULAR | Status: AC
  Filled 2015-11-20: qty 2

## 2015-11-20 MED ORDER — HYDROMORPHONE HCL 1 MG/ML IJ SOLN
0.2500 mg | INTRAMUSCULAR | Status: DC | PRN
  Administered 2015-11-20 (×3): 0.25 mg via INTRAVENOUS

## 2015-11-20 MED ORDER — ONDANSETRON HCL 4 MG/2ML IJ SOLN
INTRAMUSCULAR | Status: AC
  Filled 2015-11-20: qty 2

## 2015-11-20 MED ORDER — FENTANYL CITRATE (PF) 100 MCG/2ML IJ SOLN
INTRAMUSCULAR | Status: DC | PRN
  Administered 2015-11-20 (×5): 50 ug via INTRAVENOUS

## 2015-11-20 MED ORDER — ONDANSETRON HCL 4 MG/2ML IJ SOLN
INTRAMUSCULAR | Status: DC | PRN
  Administered 2015-11-20: 4 mg via INTRAVENOUS

## 2015-11-20 SURGICAL SUPPLY — 48 items
APPLIER CLIP 11 MED OPEN (CLIP)
APPLIER CLIP 13 LRG OPEN (CLIP)
BANDAGE ELASTIC 6 LF NS (GAUZE/BANDAGES/DRESSINGS) ×2 IMPLANT
BLADE SURG 15 STRL SS SAFETY (BLADE) ×2 IMPLANT
BNDG GAUZE 4.5X4.1 6PLY STRL (MISCELLANEOUS) ×2 IMPLANT
BULB RESERV EVAC DRAIN JP 100C (MISCELLANEOUS) ×4 IMPLANT
CANISTER SUCT 1200ML W/VALVE (MISCELLANEOUS) ×2 IMPLANT
CHLORAPREP W/TINT 26ML (MISCELLANEOUS) ×2 IMPLANT
CLIP APPLIE 11 MED OPEN (CLIP) IMPLANT
CLIP APPLIE 13 LRG OPEN (CLIP) IMPLANT
CNTNR SPEC 2.5X3XGRAD LEK (MISCELLANEOUS)
CONT SPEC 4OZ STER OR WHT (MISCELLANEOUS)
CONTAINER SPEC 2.5X3XGRAD LEK (MISCELLANEOUS) IMPLANT
DEVICE DUBIN SPECIMEN MAMMOGRA (MISCELLANEOUS) IMPLANT
DRAIN CHANNEL JP 15F RND 16 (MISCELLANEOUS) ×4 IMPLANT
DRAPE LAPAROTOMY TRNSV 106X77 (MISCELLANEOUS) ×2 IMPLANT
DRSG TELFA 3X8 NADH (GAUZE/BANDAGES/DRESSINGS) ×2 IMPLANT
ELECT CAUTERY BLADE TIP 2.5 (TIP) ×2
ELECTRODE CAUTERY BLDE TIP 2.5 (TIP) ×1 IMPLANT
GAUZE FLUFF 18X24 1PLY STRL (GAUZE/BANDAGES/DRESSINGS) ×4 IMPLANT
GAUZE SPONGE 4X4 12PLY STRL (GAUZE/BANDAGES/DRESSINGS) ×2 IMPLANT
GLOVE BIO SURGEON STRL SZ7.5 (GLOVE) ×4 IMPLANT
GLOVE INDICATOR 8.0 STRL GRN (GLOVE) ×4 IMPLANT
GOWN STRL REUS W/ TWL LRG LVL3 (GOWN DISPOSABLE) ×2 IMPLANT
GOWN STRL REUS W/TWL LRG LVL3 (GOWN DISPOSABLE) ×2
KIT RM TURNOVER STRD PROC AR (KITS) ×2 IMPLANT
LABEL OR SOLS (LABEL) ×2 IMPLANT
PACK BASIN MINOR ARMC (MISCELLANEOUS) ×2 IMPLANT
PAD GROUND ADULT SPLIT (MISCELLANEOUS) ×2 IMPLANT
PIN SAFETY STRL (MISCELLANEOUS) ×2 IMPLANT
SHEARS FOC LG CVD HARMONIC 17C (MISCELLANEOUS) IMPLANT
SLEVE PROBE SENORX GAMMA FIND (MISCELLANEOUS) IMPLANT
SPONGE LAP 18X18 5 PK (GAUZE/BANDAGES/DRESSINGS) ×2 IMPLANT
STRIP CLOSURE SKIN 1/2X4 (GAUZE/BANDAGES/DRESSINGS) ×4 IMPLANT
SUT ETHILON 3-0 FS-10 30 BLK (SUTURE) ×2
SUT SILK 0 (SUTURE) ×1
SUT SILK 0 30XBRD TIE 6 (SUTURE) ×1 IMPLANT
SUT SILK 3 0 (SUTURE) ×1
SUT SILK 3-0 18XBRD TIE 12 (SUTURE) ×1 IMPLANT
SUT VIC AB 2-0 CT1 27 (SUTURE) ×4
SUT VIC AB 2-0 CT1 TAPERPNT 27 (SUTURE) ×4 IMPLANT
SUT VIC AB 2-0 CT2 27 (SUTURE) ×4 IMPLANT
SUT VIC AB 3-0 SH 27 (SUTURE) ×1
SUT VIC AB 3-0 SH 27X BRD (SUTURE) ×1 IMPLANT
SUT VICRYL+ 3-0 144IN (SUTURE) ×2 IMPLANT
SUTURE EHLN 3-0 FS-10 30 BLK (SUTURE) ×1 IMPLANT
SWABSTK COMLB BENZOIN TINCTURE (MISCELLANEOUS) ×2 IMPLANT
TAPE TRANSPORE STRL 2 31045 (GAUZE/BANDAGES/DRESSINGS) ×2 IMPLANT

## 2015-11-20 NOTE — Progress Notes (Signed)
JP drain number1     30cc and JP drain two 15cc

## 2015-11-20 NOTE — Discharge Instructions (Signed)
AMBULATORY SURGERY  °DISCHARGE INSTRUCTIONS ° ° °1) The drugs that you were given will stay in your system until tomorrow so for the next 24 hours you should not: ° °A) Drive an automobile °B) Make any legal decisions °C) Drink any alcoholic beverage ° ° °2) You may resume regular meals tomorrow.  Today it is better to start with liquids and gradually work up to solid foods. ° °You may eat anything you prefer, but it is better to start with liquids, then soup and crackers, and gradually work up to solid foods. ° ° °3) Please notify your doctor immediately if you have any unusual bleeding, trouble breathing, redness and pain at the surgery site, drainage, fever, or pain not relieved by medication. ° ° ° °4) Additional Instructions: ° ° ° ° ° ° ° °Please contact your physician with any problems or Same Day Surgery at [phone], Monday through Friday 6 am to 4 pm, or [place] at [place] Main number at [phone].AMBULATORY SURGERY  °DISCHARGE INSTRUCTIONS ° ° °5) The drugs that you were given will stay in your system until tomorrow so for the next 24 hours you should not: ° °D) Drive an automobile °E) Make any legal decisions °F) Drink any alcoholic beverage ° ° °6) You may resume regular meals tomorrow.  Today it is better to start with liquids and gradually work up to solid foods. ° °You may eat anything you prefer, but it is better to start with liquids, then soup and crackers, and gradually work up to solid foods. ° ° °7) Please notify your doctor immediately if you have any unusual bleeding, trouble breathing, redness and pain at the surgery site, drainage, fever, or pain not relieved by medication. ° ° ° °8) Additional Instructions: ° ° ° ° ° ° ° °Please contact your physician with any problems or Same Day Surgery at [phone], Monday through Friday 6 am to 4 pm, or [place] at [place] Main number at [phone]. °

## 2015-11-20 NOTE — Anesthesia Preprocedure Evaluation (Signed)
Anesthesia Evaluation  Patient identified by MRN, date of birth, ID band Patient awake    Reviewed: Allergy & Precautions, H&P , NPO status , Patient's Chart, lab work & pertinent test results, reviewed documented beta blocker date and time   Airway Mallampati: II  TM Distance: >3 FB Neck ROM: full    Dental  (+) Teeth Intact   Pulmonary neg pulmonary ROS, former smoker,    Pulmonary exam normal        Cardiovascular Exercise Tolerance: Good negative cardio ROS Normal cardiovascular exam Rate:Normal     Neuro/Psych negative neurological ROS  negative psych ROS   GI/Hepatic negative GI ROS, Neg liver ROS,   Endo/Other  negative endocrine ROS  Renal/GU negative Renal ROS  negative genitourinary   Musculoskeletal   Abdominal   Peds  Hematology negative hematology ROS (+)   Anesthesia Other Findings   Reproductive/Obstetrics negative OB ROS                             Anesthesia Physical Anesthesia Plan  ASA: II  Anesthesia Plan: General LMA   Post-op Pain Management:    Induction:   Airway Management Planned:   Additional Equipment:   Intra-op Plan:   Post-operative Plan:   Informed Consent: I have reviewed the patients History and Physical, chart, labs and discussed the procedure including the risks, benefits and alternatives for the proposed anesthesia with the patient or authorized representative who has indicated his/her understanding and acceptance.     Plan Discussed with: CRNA  Anesthesia Plan Comments:         Anesthesia Quick Evaluation  

## 2015-11-20 NOTE — Op Note (Signed)
Preoperative diagnosis: BRCA positive.  Postoperative diagnosis: Same.  Operative procedure: Bilateral simple mastectomy.  Operating surgeon: Ollen Bowl, M.D.  Anesthesia: Gen. by LMA.  Estimated blood loss: 30 mL.  Clinical note: This 54 year old woman has been diagnosed as being BRCA-2 positive. Her twins Sr. recently had bilateral breast cancer. Desired prophylactic mastectomy.  Operative note: The patient received Kefzol prior to procedure. Pneumatic compression stockings were used for DVT prophylaxis.  After the induction of general anesthesia the chest and breast was prepped with ChloraPrep and draped. Bilateral elliptical incisions were outlined. The left breast was approached first. The skin was incised sharply undermining dissection completed electrocautery. 5 mm flaps were elevated circumferentially with the sternum, clavicle, pectoralis muscle and rectus fascias margins. The breast was removed from the underlying muscle with the fascia of that muscle with the specimen. Hemostasis was electrocautery and 3-0 Vicryl ties. A few small muscle bleeders were controlled with 3-0 Vicryl figure-of-eight sutures. A Blake drain was placed and brought out through a separate stab wound incision in the medial aspect of the inferior flap. This was anchored in place with 3-0 nylon. The skin flaps were approximately with running 2-0 Vicryl deep dermal sutures.  Attention was turned to the right breast. A similar process was undertaken. Both breast specimens were sent to pathology fresh per protocol. The right breast wound was closed as on the left. Both drains were placed to close suction. Benzoin, Steri-Strips, Telfa, fluffed gauze and Kerlix were then applied.  The patient was taken to the recovery room in stable condition.

## 2015-11-20 NOTE — H&P (Signed)
54 year old woman, BRCA-2 positive for bilateral simple mastectomy for breast cancer prophylaxis.  History anxiety.  No change in cardiopulmonary status.

## 2015-11-20 NOTE — Progress Notes (Signed)
States her chest hurts especially in middle but does extent to breasts on both sides   Says it is not like chest heart pain   Sore

## 2015-11-20 NOTE — Transfer of Care (Signed)
Immediate Anesthesia Transfer of Care Note  Patient: Candice MoynahanSharon B Regis  Procedure(s) Performed: Procedure(s): SIMPLE MASTECTOMY (Bilateral)  Patient Location: PACU  Anesthesia Type:General  Level of Consciousness: awake and alert   Airway & Oxygen Therapy: Patient Spontanous Breathing and Patient connected to face mask oxygen  Post-op Assessment: Report given to RN and Post -op Vital signs reviewed and stable  Post vital signs: Reviewed and stable  Last Vitals:  Filed Vitals:   11/20/15 0909  BP: 116/75  Pulse: 86  Temp: 36.5 C  Resp: 20    Complications: No apparent anesthesia complications

## 2015-11-23 ENCOUNTER — Ambulatory Visit (INDEPENDENT_AMBULATORY_CARE_PROVIDER_SITE_OTHER): Payer: 59 | Admitting: General Surgery

## 2015-11-23 ENCOUNTER — Encounter: Payer: Self-pay | Admitting: General Surgery

## 2015-11-23 VITALS — BP 116/74 | HR 76 | Resp 14 | Ht 64.0 in | Wt 131.0 lb

## 2015-11-23 DIAGNOSIS — Z1501 Genetic susceptibility to malignant neoplasm of breast: Secondary | ICD-10-CM

## 2015-11-23 DIAGNOSIS — Z1509 Genetic susceptibility to other malignant neoplasm: Principal | ICD-10-CM

## 2015-11-23 LAB — SURGICAL PATHOLOGY

## 2015-11-23 NOTE — Anesthesia Postprocedure Evaluation (Signed)
Anesthesia Post Note  Patient: Candice MoynahanSharon B Henderson  Procedure(s) Performed: Procedure(s) (LRB): SIMPLE MASTECTOMY (Bilateral)  Patient location during evaluation: PACU Anesthesia Type: General Level of consciousness: awake and alert Pain management: pain level controlled Vital Signs Assessment: post-procedure vital signs reviewed and stable Respiratory status: spontaneous breathing, nonlabored ventilation, respiratory function stable and patient connected to nasal cannula oxygen Cardiovascular status: blood pressure returned to baseline and stable Postop Assessment: no signs of nausea or vomiting Anesthetic complications: no    Last Vitals:  Filed Vitals:   11/20/15 1316 11/20/15 1406  BP: 114/58 96/58  Pulse: 57 75  Temp: 35.3 C   Resp: 16     Last Pain:  Filed Vitals:   11/23/15 0831  PainSc: 0-No pain                 Yevette EdwardsJames G Adams

## 2015-11-23 NOTE — Patient Instructions (Signed)
Patient to return in one week. 

## 2015-11-23 NOTE — Progress Notes (Signed)
Patient ID: Candice Henderson, female   DOB: 1961/08/31, 54 y.o.   MRN: [id number]  Chief Complaint  Patient presents with  . Routine Post Op    bil mastectomy    HPI Candice JOERGER is a 54 y.o. female here today for her post op bilateral mastectomy done on 11/20/15. Patient states she is doing well at this time. Drain  Sheet present. She is accompanied today by her step daughter and grandchild.  The patient reports she's had some "phantom" stinging/burning pain on both mastectomy sites, transient in nature. HPI  Past Medical History  Diagnosis Date  . Hyperlipidemia   . BRCA2 positive     Past Surgical History  Procedure Laterality Date  . Cesarean section    . Carpal tunnel release  15 years ago    Thumb joint reconstruction  . Tubal ligation    . Abdominal hysterectomy    . Laparoscopic oopherectomy    . Simple mastectomy with axillary sentinel node biopsy Bilateral 11/20/2015    Procedure: SIMPLE MASTECTOMY;  Surgeon: Robert Bellow, MD;  Location: [facility] ORS;  Service: General;  Laterality: Bilateral;  . Breast surgery Bilateral November 20, 2015    Bilateral prophylactic simple mastectomy    Family History  Problem Relation Age of Onset  . Breast cancer Sister     stage 2, triple neg, BRCA pos  . Lung cancer Father   . Liver cancer Mother     Social History Social History  Substance Use Topics  . Smoking status: Former Smoker -- 0.25 packs/day for 30 years    Types: Cigarettes    Quit date: 10/26/2015  . Smokeless tobacco: Never Used  . Alcohol Use: 0.0 oz/week    0 Standard drinks or equivalent per week     Comment: OCC    Allergies  Allergen Reactions  . Codeine Nausea And Vomiting and Rash  . Macrobid [Nitrofurantoin] Nausea And Vomiting and Rash    Current Outpatient Prescriptions  Medication Sig Dispense Refill  . citalopram (CELEXA) 20 MG tablet TK 1 T PO  DAILY-AM  1  . clonazePAM (KLONOPIN) 1 MG tablet TK 1 T PO BID PRA  0  . nicotine  (NICODERM CQ - DOSED IN MG/24 HOURS) 21 mg/24hr patch Place 21 mg onto the skin daily.    Marland Kitchen oxyCODONE-acetaminophen (ROXICET) 5-325 MG tablet Take 1-2 tablets by mouth every 4 (four) hours as needed for moderate pain or severe pain. 30 tablet 0  . pravastatin (PRAVACHOL) 40 MG tablet TK 1 T PO Q EVENING  1   No current facility-administered medications for this visit.    Review of Systems Review of Systems  Constitutional: Negative.   Respiratory: Negative.   Cardiovascular: Negative.     Blood pressure 116/74, pulse 76, resp. rate 14, height '5\' 4"'  (1.626 m), weight 131 lb (59.421 kg).  Physical Exam Physical Exam  Constitutional: She is oriented to person, place, and time. She appears well-developed and well-nourished.  Pulmonary/Chest:    Neurological: She is alert and oriented to person, place, and time.  Skin: Skin is warm and dry.    Data Reviewed Pathology showed no evidence of malignancy in either breast.  Drain record sheet shows volumes greater than 30 mL per day.  Assessment    Doing well status post bilateral simple mastectomy.    Plan    The patient may shower. She's been discouraged from repetitive physical activity. She may lift as she is comfortable.   Patient  to return in one week.  PCP:  Lorella Nimrod, Forest Gleason 11/23/2015, 2:37 PM

## 2015-12-01 ENCOUNTER — Ambulatory Visit (INDEPENDENT_AMBULATORY_CARE_PROVIDER_SITE_OTHER): Payer: 59 | Admitting: General Surgery

## 2015-12-01 ENCOUNTER — Encounter: Payer: Self-pay | Admitting: General Surgery

## 2015-12-01 VITALS — BP 122/78 | HR 68 | Resp 12 | Ht 64.0 in | Wt 132.0 lb

## 2015-12-01 DIAGNOSIS — Z1509 Genetic susceptibility to other malignant neoplasm: Principal | ICD-10-CM

## 2015-12-01 DIAGNOSIS — Z1501 Genetic susceptibility to malignant neoplasm of breast: Secondary | ICD-10-CM

## 2015-12-01 NOTE — Patient Instructions (Signed)
The patient is aware to call back for any questions or concerns.  

## 2015-12-01 NOTE — Progress Notes (Signed)
Patient ID: Candice Henderson, female   DOB: [birth year], 54 y.o.   MRN: [id number]  Chief Complaint  Patient presents with  . Routine Post Op    mastectomy    HPI Candice Henderson is a 54 y.o. female here today for her post op bilateral mastectomy done on 11/20/15. Drain sheet present. She states she is still having some pain but it's improving, left worse than right. I personally reviewed the patient's history. HPI  Past Medical History  Diagnosis Date  . Hyperlipidemia   . BRCA2 positive     Past Surgical History  Procedure Laterality Date  . Cesarean section    . Carpal tunnel release  15 years ago    Thumb joint reconstruction  . Tubal ligation    . Abdominal hysterectomy    . Laparoscopic oopherectomy    . Simple mastectomy with axillary sentinel node biopsy Bilateral 11/20/2015    Procedure: SIMPLE MASTECTOMY;  Surgeon: Robert Bellow, MD;  Location: [facility] ORS;  Service: General;  Laterality: Bilateral;  . Breast surgery Bilateral November 20, 2015    Bilateral prophylactic simple mastectomy    Family History  Problem Relation Age of Onset  . Breast cancer Sister     stage 2, triple neg, BRCA pos  . Lung cancer Father   . Liver cancer Mother     Social History Social History  Substance Use Topics  . Smoking status: Former Smoker -- 0.25 packs/day for 30 years    Types: Cigarettes    Quit date: 10/26/2015  . Smokeless tobacco: Never Used  . Alcohol Use: 0.0 oz/week    0 Standard drinks or equivalent per week     Comment: OCC    Allergies  Allergen Reactions  . Codeine Nausea And Vomiting and Rash  . Macrobid [Nitrofurantoin] Nausea And Vomiting and Rash    Current Outpatient Prescriptions  Medication Sig Dispense Refill  . citalopram (CELEXA) 20 MG tablet TK 1 T PO  DAILY-AM  1  . clonazePAM (KLONOPIN) 1 MG tablet TK 1 T PO BID PRA  0  . nicotine (NICODERM CQ - DOSED IN MG/24 HOURS) 21 mg/24hr patch Place 21 mg onto the skin daily.    Marland Kitchen  oxyCODONE-acetaminophen (ROXICET) 5-325 MG tablet Take 1-2 tablets by mouth every 4 (four) hours as needed for moderate pain or severe pain. 30 tablet 0  . pravastatin (PRAVACHOL) 40 MG tablet TK 1 T PO Q EVENING  1   No current facility-administered medications for this visit.    Review of Systems Review of Systems  Constitutional: Negative.   Respiratory: Negative.   Cardiovascular: Negative.     Blood pressure 122/78, pulse 68, resp. rate 12, height '5\' 4"'  (1.626 m), weight 132 lb (59.875 kg).  Physical Exam Physical Exam  Constitutional: She is oriented to person, place, and time. She appears well-developed and well-nourished.  Pulmonary/Chest:  Both drains removed. Incisions healing well.  Neurological: She is alert and oriented to person, place, and time.  Skin: Skin is warm and dry.  Psychiatric: Her behavior is normal.    Data Reviewed Drain record sheet shows volumes less than 15 mL per day on each side.  Assessment    Doing well status post bilateral simple mastectomy.  Additional effort on shoulder range of motion required.    Plan    Stretching exercises discussed. Use of local heat with care to prevent thermal injury to areas that may be less sensate reviewed.  Follow up in 2 weeks.  PCP:  Zara Chess   This information has been scribed by Karie Fetch RNBC.    Robert Bellow 12/02/2015, 4:02 PM

## 2015-12-15 ENCOUNTER — Ambulatory Visit (INDEPENDENT_AMBULATORY_CARE_PROVIDER_SITE_OTHER): Payer: BLUE CROSS/BLUE SHIELD | Admitting: General Surgery

## 2015-12-15 ENCOUNTER — Encounter: Payer: Self-pay | Admitting: General Surgery

## 2015-12-15 VITALS — BP 98/60 | HR 74 | Resp 14 | Ht 64.5 in | Wt 132.0 lb

## 2015-12-15 DIAGNOSIS — Z1509 Genetic susceptibility to other malignant neoplasm: Principal | ICD-10-CM

## 2015-12-15 DIAGNOSIS — Z1501 Genetic susceptibility to malignant neoplasm of breast: Secondary | ICD-10-CM

## 2015-12-15 NOTE — Progress Notes (Signed)
Patient ID: Candice Henderson, female   DOB: 1961/02/16, 55 y.o.   MRN: [id number]  Chief Complaint  Patient presents with  . Routine Post Op    HPI Candice Henderson is a 55 y.o. female.  here today for her post op bilateral mastectomy done on 11/20/15.  She states she is still having some pain, states her skin is "sensitive" to touch, but it's improving but seems to be "slow" to her.   The patient reports she's been instructed on things that she should and should not be doing by multiple family members. This along with the hypersensitivity along the chest wall is frustrating her return to her normal exercise program. She is making use of anti-inflammatories as previously encouraged. She is to work diligently to improve her shoulder range of motion.  I personally reviewed the patient's history.  HPI  Past Medical History  Diagnosis Date  . Hyperlipidemia   . BRCA2 positive     Past Surgical History  Procedure Laterality Date  . Cesarean section    . Carpal tunnel release  15 years ago    Thumb joint reconstruction  . Tubal ligation    . Abdominal hysterectomy    . Laparoscopic oopherectomy    . Simple mastectomy with axillary sentinel node biopsy Bilateral 11/20/2015    Procedure: SIMPLE MASTECTOMY;  Surgeon: Robert Bellow, MD;  Location: [facility] OR[facility];  Service: General;  Laterality: Bilateral;  . Breast surgery Bilateral November 20, 2015    Bilateral prophylactic simple mastectomy    Family History  Problem Relation Age of Onset  . Breast cancer Sister     stage 2, triple neg, BRCA pos  . Lung cancer Father   . Liver cancer Mother     Social History Social History  Substance Use Topics  . Smoking status: Former Smoker -- 0.25 packs/day for 30 years    Types: Cigarettes    Quit date: 10/26/2015  . Smokeless tobacco: Never Used  . Alcohol Use: 0.0 oz/week    0 Standard drinks or equivalent per week     Comment: OCC    Allergies  Allergen Reactions  . Codeine Nausea  And Vomiting and Rash  . Macrobid [Nitrofurantoin] Nausea And Vomiting and Rash    Current Outpatient Prescriptions  Medication Sig Dispense Refill  . citalopram (CELEXA) 20 MG tablet TK 1 T PO  DAILY-AM  1  . clonazePAM (KLONOPIN) 1 MG tablet TK 1 T PO BID PRA  0  . ibuprofen (ADVIL,MOTRIN) 800 MG tablet Take 800 mg by mouth 3 (three) times daily.    . nicotine (NICODERM CQ - DOSED IN MG/24 HOURS) 21 mg/24hr patch Place 21 mg onto the skin daily.    . pravastatin (PRAVACHOL) 40 MG tablet TK 1 T PO Q EVENING  1   No current facility-administered medications for this visit.    Review of Systems Review of Systems  Constitutional: Negative.   Respiratory: Negative.   Cardiovascular: Negative.     Blood pressure 98/60, pulse 74, resp. rate 14, height 5' 4.5" (1.638 m), weight 132 lb (59.875 kg).  Physical Exam Physical Exam  Constitutional: She is oriented to person, place, and time. She appears well-developed and well-nourished.  HENT:  Mouth/Throat: Oropharynx is clear and moist.  Pulmonary/Chest:  Excellent range of motion with upper extremities. Mastectomy sites well healed.  Lymphadenopathy:    She has no axillary adenopathy.  Neurological: She is alert and oriented to person, place, and time.  Skin:  Skin is warm and dry.  Psychiatric: Her behavior is normal.      Assessment    Progress with range of motion. Mild hypersensitivity over the surgical sites.    Plan    The patient's excellent progress was reviewed. She is 4 weeks postop but has shown marked improvement in her shoulder range of motion, pain levels and wound appearance. The opportunity to make use of Lyrica was discussed, but the multiple side effects was off putting.  She is released to full activity as she is comfortable with the understanding that results in of the strenuous exercise program will likely result in soreness but result and no harm.    No activity restrictions.  Follow up in 2  months.  PCP:  Zara Chess This information has been scribed by Karie Fetch RNBC.   Robert Bellow 12/15/2015, 9:25 PM

## 2015-12-15 NOTE — Patient Instructions (Addendum)
The patient is aware to call back for any questions or concerns. No activity restrictions.  

## 2016-02-09 ENCOUNTER — Encounter: Payer: Self-pay | Admitting: General Surgery

## 2016-02-09 ENCOUNTER — Ambulatory Visit (INDEPENDENT_AMBULATORY_CARE_PROVIDER_SITE_OTHER): Payer: BLUE CROSS/BLUE SHIELD | Admitting: General Surgery

## 2016-02-09 VITALS — BP 118/64 | HR 70 | Resp 12 | Ht 64.5 in | Wt 135.0 lb

## 2016-02-09 DIAGNOSIS — Z1509 Genetic susceptibility to other malignant neoplasm: Principal | ICD-10-CM

## 2016-02-09 DIAGNOSIS — Z1501 Genetic susceptibility to malignant neoplasm of breast: Secondary | ICD-10-CM

## 2016-02-09 NOTE — Patient Instructions (Addendum)
The patient is aware to call back for any questions or concerns. Patient to return in December 2017

## 2016-02-09 NOTE — Progress Notes (Signed)
Patient ID: Candice Henderson, female   DOB: [birth year], 55 y.o.   MRN: [id number]  Chief Complaint  Patient presents with  . Follow-up    bil mastectomy    HPI Candice Henderson is a 55 y.o. female.  Here today for follow up bilateral mastectomy. She states she is doing well. No new issues.  The patient is much more calm than on her last visit.  She is having some very mild discomfort from the scant "dogears" at the lateral incision margins.  At first I reviewed the patient's history. HPI    Past Medical History  Diagnosis Date  . Hyperlipidemia   . BRCA2 positive     Past Surgical History  Procedure Laterality Date  . Cesarean section    . Carpal tunnel release  15 years ago    Thumb joint reconstruction  . Tubal ligation    . Abdominal hysterectomy    . Laparoscopic oopherectomy    . Simple mastectomy with axillary sentinel node biopsy Bilateral 11/20/2015    Procedure: SIMPLE MASTECTOMY;  Surgeon: Robert Bellow, MD;  Location: [facility] OR[facility];  Service: General;  Laterality: Bilateral;  . Breast surgery Bilateral November 20, 2015    Bilateral prophylactic simple mastectomy    Family History  Problem Relation Age of Onset  . Breast cancer Sister     stage 2, triple neg, BRCA pos  . Lung cancer Father   . Liver cancer Mother   . Skin cancer Sister     2016    Social History Social History  Substance Use Topics  . Smoking status: Former Smoker -- 0.25 packs/day for 30 years    Types: Cigarettes    Quit date: 10/26/2015  . Smokeless tobacco: Never Used  . Alcohol Use: 0.0 oz/week    0 Standard drinks or equivalent per week     Comment: OCC    Allergies  Allergen Reactions  . Codeine Nausea And Vomiting and Rash  . Macrobid [Nitrofurantoin] Nausea And Vomiting and Rash    Current Outpatient Prescriptions  Medication Sig Dispense Refill  . citalopram (CELEXA) 40 MG tablet TK 1 T PO D  1  . clonazePAM (KLONOPIN) 1 MG tablet TK 1 T PO BID PRA  0  . ibuprofen  (ADVIL,MOTRIN) 800 MG tablet Take 800 mg by mouth 3 (three) times daily.    . pravastatin (PRAVACHOL) 40 MG tablet TK 1 T PO Q EVENING  1  . Vitamin D, Ergocalciferol, (DRISDOL) 50000 units CAPS capsule TK 1 C PO 1 TIME A WK  0   No current facility-administered medications for this visit.    Review of Systems Review of Systems  Constitutional: Negative.   Respiratory: Negative.   Cardiovascular: Negative.     Blood pressure 118/64, pulse 70, resp. rate 12, height 5' 4.5" (1.638 m), weight 135 lb (61.236 kg).  Physical Exam Physical Exam  Constitutional: She is oriented to person, place, and time. She appears well-developed and well-nourished.  Eyes: Conjunctivae are normal. No scleral icterus.  Neck: Neck supple.  Cardiovascular: Normal rate, regular rhythm and normal heart sounds.   Pulmonary/Chest: Effort normal and breath sounds normal.    Bilateral mastectomy site are clean and healing well.   Lymphadenopathy:    She has no cervical adenopathy.  Neurological: She is alert and oriented to person, place, and time.  Skin: Skin is warm and dry.    Data Reviewed Pathology showed no evidence of malignancy.  Assessment  Doing well status post bilateral mastectomy.    Plan    A prescription for breast prostheses was provided. She was encouraged to allow further resolution of the swelling along the edge of the incision to resolve before making any decision regarding removal of the minimally redundant tissue. She does report that her Celexa dose has been doubled since her last visit with improvement in her symptomatology.    Patient to return in December 2017. PCP:  Zara Chess This information has been scribed by Karie Fetch RNBC.   Robert Bellow 02/10/2016, 6:49 AM

## 2016-09-11 ENCOUNTER — Emergency Department
Admission: EM | Admit: 2016-09-11 | Discharge: 2016-09-11 | Disposition: A | Discharge status: Home / Self Care | Payer: BLUE CROSS/BLUE SHIELD | Source: Home / Self Care | Attending: Family Medicine | Admitting: Family Medicine

## 2016-09-11 ENCOUNTER — Encounter: Payer: Self-pay | Admitting: Emergency Medicine

## 2016-09-11 ENCOUNTER — Emergency Department (INDEPENDENT_AMBULATORY_CARE_PROVIDER_SITE_OTHER): Payer: BLUE CROSS/BLUE SHIELD

## 2016-09-11 DIAGNOSIS — W2203XA Walked into furniture, initial encounter: Secondary | ICD-10-CM | POA: Diagnosis not present

## 2016-09-11 DIAGNOSIS — S92511A Displaced fracture of proximal phalanx of right lesser toe(s), initial encounter for closed fracture: Secondary | ICD-10-CM

## 2016-09-11 DIAGNOSIS — S92501A Displaced unspecified fracture of right lesser toe(s), initial encounter for closed fracture: Secondary | ICD-10-CM | POA: Diagnosis not present

## 2016-09-11 MED ORDER — OXYCODONE-ACETAMINOPHEN 5-325 MG PO TABS: 1.0000 | ORAL_TABLET | ORAL | 0 refills | Status: DC | PRN

## 2016-09-11 MED ORDER — IBUPROFEN 600 MG PO TABS
600.0000 mg | ORAL_TABLET | Freq: Once | ORAL | Status: AC
  Administered 2016-09-11: 600 mg via ORAL

## 2016-09-11 NOTE — ED Triage Notes (Signed)
Pt states she hit her right little toe and side of foot on her bed about 1 hour ago. Pain and redness. No hx of injury to that foot.

## 2016-09-11 NOTE — Discharge Instructions (Signed)
Elevate foot.  Apply ice pack for 15 to 20 minutes, 3 to 4 times daily  Continue until pain decreases.

## 2016-09-11 NOTE — ED Provider Notes (Signed)
[facility]    CSN: [id number] Arrival date & time: 09/11/16  1747     History   Chief Complaint Chief Complaint  Patient presents with  . Foot Pain    HPI Candice Henderson is a 55 y.o. female.   Patient bumped her right 5th toe on a bed frame about 1.5 hours ago.  She has had persistent pain and deformity of her 5th toe.   The history is provided by the patient.  Foot Pain  This is a new problem. The current episode started 1 to 2 hours ago. The problem occurs constantly. The problem has not changed since onset.The symptoms are aggravated by walking. Nothing relieves the symptoms. She has tried nothing for the symptoms.    Past Medical History:  Diagnosis Date  . BRCA2 positive   . Hyperlipidemia     Patient Active Problem List   Diagnosis Date Noted  . BRCA2 positive 10/07/2015    Past Surgical History:  Procedure Laterality Date  . ABDOMINAL HYSTERECTOMY    . BREAST SURGERY Bilateral November 20, 2015   Bilateral prophylactic simple mastectomy  . CARPAL TUNNEL RELEASE  15 years ago   Thumb joint reconstruction  . CESAREAN SECTION    . LAPAROSCOPIC OOPHERECTOMY    . SIMPLE MASTECTOMY WITH AXILLARY SENTINEL NODE BIOPSY Bilateral 11/20/2015   Procedure: SIMPLE MASTECTOMY;  Surgeon: Robert Bellow, MD;  Location: [facility] ORS;  Service: General;  Laterality: Bilateral;  . TUBAL LIGATION      OB History    Gravida Para Term Preterm AB Living   1             SAB TAB Ectopic Multiple Live Births                  Obstetric Comments   1st Menstrual Cycle:  14 1st Pregnancy:  31        Home Medications    Prior to Admission medications   Medication Sig Start Date End Date Taking? Authorizing Provider  citalopram (CELEXA) 40 MG tablet TK 1 T PO D 01/28/16   Historical Provider, MD  clonazePAM (KLONOPIN) 1 MG tablet TK 1 T PO BID PRA 09/15/15   Historical Provider, MD  ibuprofen (ADVIL,MOTRIN) 800 MG tablet Take 800 mg by mouth 3 (three) times  daily.    Historical Provider, MD  oxyCODONE-acetaminophen (ROXICET) 5-325 MG tablet Take 1 tablet by mouth every 4 (four) hours as needed for severe pain. 09/11/16   Kandra Nicolas, MD  pravastatin (PRAVACHOL) 40 MG tablet TK 1 T PO Q EVENING 09/15/15   Historical Provider, MD  Vitamin D, Ergocalciferol, (DRISDOL) 50000 units CAPS capsule TK 1 C PO 1 TIME A WK 01/13/16   Historical Provider, MD    Family History Family History  Problem Relation Age of Onset  . Liver cancer Mother   . Lung cancer Father   . Breast cancer Sister     stage 2, triple neg, BRCA pos  . Skin cancer Sister     2016    Social History Social History  Substance Use Topics  . Smoking status: Former Smoker    Packs/day: 0.25    Years: 30.00    Types: Cigarettes    Quit date: 10/26/2015  . Smokeless tobacco: Never Used  . Alcohol use 0.0 oz/week     Comment: OCC     Allergies   Codeine and Macrobid [nitrofurantoin]   Review of Systems Review of Systems  All other systems reviewed and are negative.    Physical Exam Triage Vital Signs ED Triage Vitals  Enc Vitals Group     BP 09/11/16 1825 97/64     Pulse Rate 09/11/16 1825 78     Resp --      Temp 09/11/16 1825 98 F (36.7 C)     Temp Source 09/11/16 1825 Oral     SpO2 09/11/16 1825 98 %     Weight 09/11/16 1826 136 lb (61.7 kg)     Height --      Head Circumference --      Peak Flow --      Pain Score 09/11/16 1827 5     Pain Loc --      Pain Edu? --      Excl. in [place]? --    No data found.   Updated Vital Signs BP 97/64 (BP Location: Right Arm)   Pulse 78   Temp 98 F (36.7 C) (Oral)   Wt 136 lb (61.7 kg)   SpO2 98%   BMI 22.98 kg/m   Visual Acuity Right Eye Distance:   Left Eye Distance:   Bilateral Distance:    Right Eye Near:   Left Eye Near:    Bilateral Near:     Physical Exam  Constitutional: She appears well-developed and well-nourished. No distress.  HENT:  Head: Atraumatic.  Eyes: Pupils are equal, round,  and reactive to light.  Cardiovascular: Normal rate.   Pulmonary/Chest: Effort normal.  Musculoskeletal:       Right foot: There is decreased range of motion, tenderness, bony tenderness, swelling and deformity. There is normal capillary refill and no laceration.       Feet:  Right fifth toe is mildly swollen, tender, and deviated laterally.  Decreased range of motion.  Neurological: She is alert.  Skin: Skin is warm and dry.  Nursing note and vitals reviewed.    UC Treatments / Results  Labs (all labs ordered are listed, but only abnormal results are displayed) Labs Reviewed - No data to display  EKG  EKG Interpretation None       Radiology Dg Foot Complete Right  Result Date: 09/11/2016 CLINICAL DATA:  Hit foot against bed today.  Initial encounter. EXAM: RIGHT FOOT COMPLETE - 3+ VIEW COMPARISON:  None. FINDINGS: Oblique displaced fifth proximal phalanx fracturewithout articular involvement. No other acute finding. Incidental osseous remodeling along the proximal third and fourth metatarsals. IMPRESSION: Displaced fifth proximal phalanx shaft fracture. Electronically Signed   By: Monte Fantasia M.D.   On: 09/11/2016 18:38    Procedures Procedures (including critical care time)  Medications Ordered in UC Medications  ibuprofen (ADVIL,MOTRIN) tablet 600 mg (600 mg Oral Given 09/11/16 1844)     Initial Impression / Assessment and Plan / UC Course  I have reviewed the triage vital signs and the nursing notes.  Pertinent labs & imaging results that were available during my care of the patient were reviewed by me and considered in my medical decision making (see chart for details).  Clinical Course  Elevate foot.  Apply ice pack for 15 to 20 minutes, 3 to 4 times daily  Continue until pain decreases.  Rx written for Percocet 5-325, #12 Followup with Dr. Aundria Mems or Dr. Lynne Leader ([facility]) tomorrow    Final Clinical Impressions(s) / UC  Diagnoses   Final diagnoses:  Fracture of fifth toe, right, closed    New Prescriptions Discharge Medication List as  of 09/11/2016  7:01 PM    START taking these medications   Details  oxyCODONE-acetaminophen (ROXICET) 5-325 MG tablet Take 1 tablet by mouth every 4 (four) hours as needed for severe pain., Starting Sun 09/11/2016, Print         Kandra Nicolas, MD 09/11/16 [phone]

## 2016-09-12 ENCOUNTER — Ambulatory Visit (INDEPENDENT_AMBULATORY_CARE_PROVIDER_SITE_OTHER): Payer: BLUE CROSS/BLUE SHIELD | Admitting: Sports Medicine

## 2016-09-12 ENCOUNTER — Encounter: Payer: Self-pay | Admitting: Sports Medicine

## 2016-09-12 ENCOUNTER — Ambulatory Visit (INDEPENDENT_AMBULATORY_CARE_PROVIDER_SITE_OTHER): Payer: BLUE CROSS/BLUE SHIELD

## 2016-09-12 ENCOUNTER — Telehealth: Payer: Self-pay | Admitting: *Deleted

## 2016-09-12 DIAGNOSIS — S92511A Displaced fracture of proximal phalanx of right lesser toe(s), initial encounter for closed fracture: Secondary | ICD-10-CM

## 2016-09-12 DIAGNOSIS — X58XXXA Exposure to other specified factors, initial encounter: Secondary | ICD-10-CM | POA: Diagnosis not present

## 2016-09-12 MED ORDER — OXYCODONE-ACETAMINOPHEN 5-325 MG PO TABS: 1.0000 | ORAL_TABLET | Freq: Three times a day (TID) | ORAL | 0 refills | Status: DC | PRN

## 2016-09-12 MED ORDER — HYDROCODONE-ACETAMINOPHEN 5-325 MG PO TABS: 1.0000 | ORAL_TABLET | Freq: Three times a day (TID) | ORAL | 0 refills | Status: DC | PRN

## 2016-09-12 NOTE — Progress Notes (Signed)
   Subjective:    I'm seeing this patient as a consultation for:  Dr. Donna ChristenStephen Beese  CC: Right toe fracture  HPI: Yesterday this pleasant 55 year old female stubbed her toe, she had immediate pain, swelling, bruising and deformity.  Pain was severe, persistent, she went to urgent care where x-rays showed an angulated displaced fracture of the fifth proximal phalanx, and she was referred to me for further evaluation and definitive treatment.  Past medical history:  Negative.  See flowsheet/record as well for more information.  Surgical history: Negative.  See flowsheet/record as well for more information.  Family history: Negative.  See flowsheet/record as well for more information.  Social history: Negative.  See flowsheet/record as well for more information.  Allergies, and medications have been entered into the medical record, reviewed, and no changes needed.   Review of Systems: No headache, visual changes, nausea, vomiting, diarrhea, constipation, dizziness, abdominal pain, skin rash, fevers, chills, night sweats, weight loss, swollen lymph nodes, body aches, joint swelling, muscle aches, chest pain, shortness of breath, mood changes, visual or auditory hallucinations.   Objective:   General: Well Developed, well nourished, and in no acute distress.  Neuro/Psych: Alert and oriented x3, extra-ocular muscles intact, able to move all 4 extremities, sensation grossly intact. Skin: Warm and dry, no rashes noted.  Respiratory: Not using accessory muscles, speaking in full sentences, trachea midline.  Cardiovascular: Pulses palpable, no extremity edema. Abdomen: Does not appear distended. Right Foot: Visible deformity of the right fifth toe Range of motion is full in all directions. Strength is 5/5 in all directions. No hallux valgus. No pes cavus or pes planus. No abnormal callus noted. No pain over the navicular prominence, or base of fifth metatarsal. No tenderness to palpation of  the calcaneal insertion of plantar fascia. No pain at the Achilles insertion. No pain over the calcaneal bursa. No pain of the retrocalcaneal bursa. Tender at the right fifth proximal phalanx No hallux rigidus or limitus. No tenderness palpation over interphalangeal joints. No pain with compression of the metatarsal heads. Neurovascularly intact distally.  Procedure:  Fracture Reduction   Risks, benefits, and alternatives explained and consent obtained. Time out conducted. Surface prepped with alcohol. 3cc lidocaine infiltrated in a hematoma block. Adequate anesthesia ensured. Fracture reduction: I used axial force to reduce the fracture. The fourth and fifth toes were then buddy taped together. Post reduction films obtained showed anatomic/near-anatomic alignment. Pt stable, aftercare and follow-up advised.  Impression and Recommendations:   This case required medical decision making of moderate complexity.  Closed displaced fracture of proximal phalanx of lesser toe of right foot Closed reduction with buddy taping. Postop shoe. Hydrocodone for pain. Return in one week for x-rays.  I billed a fracture code for this encounter, all subsequent visits will be post-op checks in the global period.

## 2016-09-12 NOTE — Telephone Encounter (Signed)
Per Dr. Rolla PlateBeese's request appointment made to see sports med asap. Scheduled for 09/12/2016 @ 315pm with Dr. Benjamin Stainhekkekandam. Apt time and location given to patient. Pt verbalized understanding,

## 2016-09-12 NOTE — Assessment & Plan Note (Signed)
Closed reduction with buddy taping. Postop shoe. Hydrocodone for pain. Return in one week for x-rays.  I billed a fracture code for this encounter, all subsequent visits will be post-op checks in the global period.

## 2016-09-13 ENCOUNTER — Encounter: Payer: Self-pay | Admitting: Sports Medicine

## 2016-09-13 ENCOUNTER — Ambulatory Visit (INDEPENDENT_AMBULATORY_CARE_PROVIDER_SITE_OTHER): Payer: BLUE CROSS/BLUE SHIELD | Admitting: Sports Medicine

## 2016-09-13 ENCOUNTER — Telehealth: Payer: Self-pay | Admitting: Sports Medicine

## 2016-09-13 ENCOUNTER — Ambulatory Visit (INDEPENDENT_AMBULATORY_CARE_PROVIDER_SITE_OTHER): Payer: BLUE CROSS/BLUE SHIELD

## 2016-09-13 ENCOUNTER — Encounter: Payer: BLUE CROSS/BLUE SHIELD | Admitting: Family Medicine

## 2016-09-13 DIAGNOSIS — S92511D Displaced fracture of proximal phalanx of right lesser toe(s), subsequent encounter for fracture with routine healing: Secondary | ICD-10-CM

## 2016-09-13 DIAGNOSIS — X58XXXD Exposure to other specified factors, subsequent encounter: Secondary | ICD-10-CM

## 2016-09-13 MED ORDER — HYDROMORPHONE HCL 4 MG PO TABS: 4.0000 mg | ORAL_TABLET | Freq: Three times a day (TID) | ORAL | 0 refills | Status: DC

## 2016-09-13 NOTE — Telephone Encounter (Signed)
Pt has appt this afternoon

## 2016-09-13 NOTE — Progress Notes (Signed)
  Subjective: 1 day post closed reduction of a right fifth proximal phalangeal fracture of the foot, had some increasing pain overnight.   Objective: General: Well-developed, well-nourished, and in no acute distress. Foot is examined: There is minimal bruising and swelling but overall looks okay.  Assessment/plan:   Closed displaced fracture of proximal phalanx of lesser toe of right foot Increase pain medication to 2 pills 3 times per day, I'm going to add a bit of hydromorphone to see how things go. Repeat x-rays today. Keep one-week follow-up.

## 2016-09-13 NOTE — Telephone Encounter (Signed)
Patient called clinic today stating her toe "is worse than it was." Pt reports after Physician put her toe "back in place" and buddy taped it her swelling has increased. Pt reports now her "entire foot is purple" and it is a "sharp stabbing pain." Pt does report she has been keeping the foot elevated and has been applying ice "off and on."   Pt also reports taking 8 Percocet tabs since 9pm last night. Advised Pt not to take any more Rx's until I returned her call. Will route to Provider for review and recommendation.

## 2016-09-13 NOTE — Assessment & Plan Note (Signed)
Increase pain medication to 2 pills 3 times per day, I'm going to add a bit of hydromorphone to see how things go. Repeat x-rays today. Keep one-week follow-up.

## 2016-09-13 NOTE — Telephone Encounter (Signed)
Increased bruising and pain is expected. Needs to push through the pain, it will improve very quickly. Stay off of the foot, and use the postop shoe if needs to walk on it. She can take up to 2 Percocet 3 times per day.

## 2016-09-19 ENCOUNTER — Ambulatory Visit (INDEPENDENT_AMBULATORY_CARE_PROVIDER_SITE_OTHER): Payer: BLUE CROSS/BLUE SHIELD

## 2016-09-19 ENCOUNTER — Other Ambulatory Visit: Payer: Self-pay | Admitting: Sports Medicine

## 2016-09-19 ENCOUNTER — Ambulatory Visit (INDEPENDENT_AMBULATORY_CARE_PROVIDER_SITE_OTHER): Payer: BLUE CROSS/BLUE SHIELD | Admitting: Sports Medicine

## 2016-09-19 DIAGNOSIS — S92501D Displaced unspecified fracture of right lesser toe(s), subsequent encounter for fracture with routine healing: Secondary | ICD-10-CM

## 2016-09-19 DIAGNOSIS — S92531D Displaced fracture of distal phalanx of right lesser toe(s), subsequent encounter for fracture with routine healing: Secondary | ICD-10-CM | POA: Diagnosis not present

## 2016-09-19 DIAGNOSIS — S92511D Displaced fracture of proximal phalanx of right lesser toe(s), subsequent encounter for fracture with routine healing: Secondary | ICD-10-CM

## 2016-09-19 DIAGNOSIS — W2203XD Walked into furniture, subsequent encounter: Secondary | ICD-10-CM

## 2016-09-19 MED ORDER — OXYCODONE-ACETAMINOPHEN 5-325 MG PO TABS: 1.0000 | ORAL_TABLET | Freq: Three times a day (TID) | ORAL | 0 refills | Status: DC | PRN

## 2016-09-19 NOTE — Assessment & Plan Note (Signed)
Good maintenance of fracture reduction and pain is well-controlled with Percocet now, hydromorphone was a bit too strong. Return in 2 weeks, x-ray before visit.

## 2016-09-19 NOTE — Progress Notes (Signed)
  Subjective: 1 week post closed reduction of a right fifth proximal phalangeal fracture. Now, starting to feel better.   Objective: General: Well-developed, well-nourished, and in no acute distress. Right foot: Toe appears well, I buddy taped the fourth and fifth toes again.  X-rays personally reviewed and show good maintenance of the fracture reduction.  Assessment/plan:   Closed displaced fracture of proximal phalanx of lesser toe of right foot Good maintenance of fracture reduction and pain is well-controlled with Percocet now, hydromorphone was a bit too strong. Return in 2 weeks, x-ray before visit.

## 2016-10-03 ENCOUNTER — Ambulatory Visit (INDEPENDENT_AMBULATORY_CARE_PROVIDER_SITE_OTHER): Payer: BLUE CROSS/BLUE SHIELD | Admitting: Sports Medicine

## 2016-10-03 ENCOUNTER — Ambulatory Visit (INDEPENDENT_AMBULATORY_CARE_PROVIDER_SITE_OTHER): Payer: BLUE CROSS/BLUE SHIELD

## 2016-10-03 ENCOUNTER — Encounter: Payer: Self-pay | Admitting: Sports Medicine

## 2016-10-03 DIAGNOSIS — S92511D Displaced fracture of proximal phalanx of right lesser toe(s), subsequent encounter for fracture with routine healing: Secondary | ICD-10-CM

## 2016-10-03 DIAGNOSIS — M25475 Effusion, left foot: Secondary | ICD-10-CM

## 2016-10-03 DIAGNOSIS — X58XXXA Exposure to other specified factors, initial encounter: Secondary | ICD-10-CM | POA: Diagnosis not present

## 2016-10-03 DIAGNOSIS — S92511A Displaced fracture of proximal phalanx of right lesser toe(s), initial encounter for closed fracture: Secondary | ICD-10-CM | POA: Diagnosis not present

## 2016-10-03 NOTE — Progress Notes (Signed)
   Subjective:    I'm seeing this patient as a consultation for:  Candice Minksonnay Elkins, NP  CC: Toe fracture and left foot swelling  HPI: Left foot swelling: Intermittent, occurs at the first metatarsophalangeal joint, occurs very quickly over hours, gets very red, swollen, exquisitely painful. Uric acid levels were normal on a previous check.  Toe fracture: 3 weeks post closed reduction of her right fifth toe fracture, things are going well, she is almost pain-free, x-rays will be reviewed below.  Past medical history:  Negative.  See flowsheet/record as well for more information.  Surgical history: Negative.  See flowsheet/record as well for more information.  Family history: Negative.  See flowsheet/record as well for more information.  Social history: Negative.  See flowsheet/record as well for more information.  Allergies, and medications have been entered into the medical record, reviewed, and no changes needed.   Review of Systems: No headache, visual changes, nausea, vomiting, diarrhea, constipation, dizziness, abdominal pain, skin rash, fevers, chills, night sweats, weight loss, swollen lymph nodes, body aches, joint swelling, muscle aches, chest pain, shortness of breath, mood changes, visual or auditory hallucinations.   Objective:   General: Well Developed, well nourished, and in no acute distress.  Neuro/Psych: Alert and oriented x3, extra-ocular muscles intact, able to move all 4 extremities, sensation grossly intact. Skin: Warm and dry, no rashes noted.  Respiratory: Not using accessory muscles, speaking in full sentences, trachea midline.  Cardiovascular: Pulses palpable, no extremity edema. Abdomen: Does not appear distended. Left Foot: No visible erythema or swelling. Range of motion is full in all directions. Strength is 5/5 in all directions. No hallux valgus. No pes cavus or pes planus. No abnormal callus noted. No pain over the navicular prominence, or base of fifth  metatarsal. No tenderness to palpation of the calcaneal insertion of plantar fascia. No pain at the Achilles insertion. No pain over the calcaneal bursa. No pain of the retrocalcaneal bursa. No tenderness to palpation over the tarsals, metatarsals, or phalanges. No hallux rigidus or limitus. No tenderness palpation over interphalangeal joints. No pain with compression of the metatarsal heads. Neurovascularly intact distally.  X-rays personally reviewed and show good stability of the reduction with bridging bony callus.  Impression and Recommendations:   This case required medical decision making of moderate complexity.  Swelling of first metatarsophalangeal (MTP) joint of left foot Currently not swollen or painful but gets episodes where it will become red, swollen, very quickly over a day, and be excruciatingly painful to even light touch. This is highly consistent with podagra/gout. Sounds as though uric acid levels were normal, however uric acid levels are often normal in patients with gout. If she has another episode, she will get in to see me as soon as possible, we will do an arthrocentesis for polarized microscopy. That is the only reliable way to truly diagnose gout.  Closed displaced fracture of proximal phalanx of lesser toe of right foot Just over 3 weeks post closed reduction, fracture remained stable. She does also have some bony callus bridging the fracture, I think it's okay for her to discontinue her postop shoe now.

## 2016-10-03 NOTE — Assessment & Plan Note (Signed)
Currently not swollen or painful but gets episodes where it will become red, swollen, very quickly over a day, and be excruciatingly painful to even light touch. This is highly consistent with podagra/gout. Sounds as though uric acid levels were normal, however uric acid levels are often normal in patients with gout. If she has another episode, she will get in to see me as soon as possible, we will do an arthrocentesis for polarized microscopy. That is the only reliable way to truly diagnose gout.

## 2016-10-03 NOTE — Assessment & Plan Note (Signed)
Just over 3 weeks post closed reduction, fracture remained stable. She does also have some bony callus bridging the fracture, I think it's okay for her to discontinue her postop shoe now.

## 2016-11-22 ENCOUNTER — Encounter: Payer: Self-pay | Admitting: *Deleted

## 2016-11-28 ENCOUNTER — Ambulatory Visit (INDEPENDENT_AMBULATORY_CARE_PROVIDER_SITE_OTHER): Payer: BLUE CROSS/BLUE SHIELD | Admitting: General Surgery

## 2016-11-28 ENCOUNTER — Encounter: Payer: Self-pay | Admitting: General Surgery

## 2016-11-28 VITALS — BP 120/74 | HR 72 | Resp 12 | Ht 64.0 in | Wt 139.0 lb

## 2016-11-28 DIAGNOSIS — Z1502 Genetic susceptibility to malignant neoplasm of ovary: Secondary | ICD-10-CM | POA: Diagnosis not present

## 2016-11-28 DIAGNOSIS — Z1501 Genetic susceptibility to malignant neoplasm of breast: Secondary | ICD-10-CM | POA: Diagnosis not present

## 2016-11-28 DIAGNOSIS — Z1509 Genetic susceptibility to other malignant neoplasm: Principal | ICD-10-CM

## 2016-11-28 NOTE — Progress Notes (Signed)
Patient ID: Candice Henderson, female   DOB: [birth year], 55 y.o.   MRN: [id number]  Chief Complaint  Patient presents with  . Follow-up    BRCA 2 +    HPI Candice Henderson is a 55 y.o. female here today for a BRCA follow up. Doing well, no new changes.   HPI  Past Medical History:  Diagnosis Date  . BRCA2 positive   . Hyperlipidemia     Past Surgical History:  Procedure Laterality Date  . ABDOMINAL HYSTERECTOMY    . BREAST SURGERY Bilateral November 20, 2015   Bilateral prophylactic simple mastectomy  . CARPAL TUNNEL RELEASE  15 years ago   Thumb joint reconstruction  . CESAREAN SECTION    . COLONOSCOPY  2017   [place]  . LAPAROSCOPIC OOPHERECTOMY    . SIMPLE MASTECTOMY WITH AXILLARY SENTINEL NODE BIOPSY Bilateral 11/20/2015   Procedure: SIMPLE MASTECTOMY;  Surgeon: Robert Bellow, MD;  Location: [facility] ORS;  Service: General;  Laterality: Bilateral;  . TUBAL LIGATION      Family History  Problem Relation Age of Onset  . Liver cancer Mother   . Lung cancer Father   . Breast cancer Sister     stage 2, triple neg, BRCA pos  . Skin cancer Sister     2016    Social History Social History  Substance Use Topics  . Smoking status: Light Tobacco Smoker    Packs/day: 0.25    Years: 30.00    Types: Cigarettes    Last attempt to quit: 10/26/2015  . Smokeless tobacco: Never Used  . Alcohol use 0.0 oz/week     Comment: OCC    Allergies  Allergen Reactions  . Codeine Nausea And Vomiting and Rash  . Macrobid [Nitrofurantoin] Nausea And Vomiting and Rash    Current Outpatient Prescriptions  Medication Sig Dispense Refill  . citalopram (CELEXA) 40 MG tablet TK 1 T PO D  1  . clonazePAM (KLONOPIN) 1 MG tablet TK 1 T PO BID PRA  0  . ibuprofen (ADVIL,MOTRIN) 800 MG tablet Take 800 mg by mouth 3 (three) times daily.    . pravastatin (PRAVACHOL) 40 MG tablet TK 1 T PO Q EVENING  1  . Vitamin D, Ergocalciferol, (DRISDOL) 50000 units CAPS capsule TK 1 C PO 1 TIME A WK   0   No current facility-administered medications for this visit.     Review of Systems Review of Systems  Constitutional: Negative.   Respiratory: Negative.   Cardiovascular: Negative.     Blood pressure 120/74, pulse 72, resp. rate 12, height '5\' 4"'  (1.626 m), weight 139 lb (63 kg).  Physical Exam Physical Exam  Constitutional: She is oriented to person, place, and time. She appears well-developed and well-nourished.  Eyes: Conjunctivae are normal. No scleral icterus.  Neck: Neck supple. No thyromegaly present.  Cardiovascular: Normal rate, regular rhythm and normal heart sounds.   Pulmonary/Chest: Effort normal and breath sounds normal.    Mastectomy scars present well healed  Lymphadenopathy:    She has no cervical adenopathy.  Neurological: She is alert and oriented to person, place, and time.  Skin: Skin is warm and dry.    Data Reviewed No malignancy and resected tissue.  Assessment    Doing well now one year status post bilateral prophylactic mastectomy for BRCA 2 Romie Minus carrier status.    Plan     Patient to call with any insertions, follow up otherwise will be on an as-needed basis.  This information has been scribed by Verlene Mayer, CMA     Robert Bellow 11/28/2016, 8:09 PM

## 2016-11-28 NOTE — Patient Instructions (Addendum)
Call office for any new  issues or concerns.  

## 2016-12-13 ENCOUNTER — Emergency Department
Admission: EM | Admit: 2016-12-13 | Discharge: 2016-12-13 | Disposition: A | Discharge status: Home / Self Care | Payer: BLUE CROSS/BLUE SHIELD | Source: Home / Self Care | Attending: Family Medicine | Admitting: Family Medicine

## 2016-12-13 ENCOUNTER — Emergency Department (INDEPENDENT_AMBULATORY_CARE_PROVIDER_SITE_OTHER): Payer: BLUE CROSS/BLUE SHIELD

## 2016-12-13 DIAGNOSIS — S20212A Contusion of left front wall of thorax, initial encounter: Secondary | ICD-10-CM

## 2016-12-13 DIAGNOSIS — R0781 Pleurodynia: Secondary | ICD-10-CM

## 2016-12-13 MED ORDER — MELOXICAM 15 MG PO TABS: 15.0000 mg | ORAL_TABLET | Freq: Every day | ORAL | 0 refills | Status: DC

## 2016-12-13 MED ORDER — OXYCODONE-ACETAMINOPHEN 5-325 MG PO TABS: ORAL_TABLET | ORAL | 0 refills | Status: DC

## 2016-12-13 MED ORDER — KETOROLAC TROMETHAMINE 60 MG/2ML IJ SOLN
60.0000 mg | Freq: Once | INTRAMUSCULAR | Status: AC
  Administered 2016-12-13: 60 mg via INTRAMUSCULAR

## 2016-12-13 NOTE — ED Provider Notes (Signed)
[facility]    CSN: [id number] Arrival date & time: 12/13/16  1425     History   Chief Complaint Chief Complaint  Patient presents with  . Chest Pain    HPI Candice Henderson is a 56 y.o. female.   Two days ago patient tripped over the legs of her rocking chair and fell against a rail, injuring her left chest.  She has had persistent left rib pain with movement and inspiration.  No shortness of breath.   The history is provided by the patient.  Chest Pain  Pain location:  L lateral chest Pain quality: aching   Pain radiates to:  Does not radiate Pain severity:  Moderate Onset quality:  Sudden Duration:  2 days Timing:  Constant Progression:  Unchanged Chronicity:  New Context: breathing, lifting and movement   Relieved by:  Nothing Worsened by:  Coughing, deep breathing and certain positions Ineffective treatments:  None tried Associated symptoms: no abdominal pain, no back pain, no cough, no diaphoresis, no fever and no shortness of breath     Past Medical History:  Diagnosis Date  . BRCA2 positive   . Hyperlipidemia     Patient Active Problem List   Diagnosis Date Noted  . Swelling of first metatarsophalangeal (MTP) joint of left foot 10/03/2016  . Closed displaced fracture of proximal phalanx of lesser toe of right foot 09/12/2016  . BRCA2 positive 10/07/2015    Past Surgical History:  Procedure Laterality Date  . ABDOMINAL HYSTERECTOMY    . BREAST SURGERY Bilateral November 20, 2015   Bilateral prophylactic simple mastectomy  . CARPAL TUNNEL RELEASE  15 years ago   Thumb joint reconstruction  . CESAREAN SECTION    . COLONOSCOPY  2017   [place]  . LAPAROSCOPIC OOPHERECTOMY    . SIMPLE MASTECTOMY WITH AXILLARY SENTINEL NODE BIOPSY Bilateral 11/20/2015   Procedure: SIMPLE MASTECTOMY;  Surgeon: Robert Bellow, MD;  Location: [facility] ORS;  Service: General;  Laterality: Bilateral;  . TUBAL LIGATION      OB History    Gravida Para Term  Preterm AB Living   1             SAB TAB Ectopic Multiple Live Births                  Obstetric Comments   1st Menstrual Cycle:  14 1st Pregnancy:  31        Home Medications    Prior to Admission medications   Medication Sig Start Date End Date Taking? Authorizing Provider  citalopram (CELEXA) 40 MG tablet TK 1 T PO D 01/28/16   Historical Provider, MD  clonazePAM (KLONOPIN) 1 MG tablet TK 1 T PO BID PRA 09/15/15   Historical Provider, MD  ibuprofen (ADVIL,MOTRIN) 800 MG tablet Take 800 mg by mouth 3 (three) times daily.    Historical Provider, MD  meloxicam (MOBIC) 15 MG tablet Take 1 tablet (15 mg total) by mouth daily. Take with food each morning 12/13/16   Kandra Nicolas, MD  oxyCODONE-acetaminophen (ROXICET) 5-325 MG tablet Take one tab PO HS prn pain 12/13/16   Kandra Nicolas, MD  pravastatin (PRAVACHOL) 40 MG tablet TK 1 T PO Q EVENING 09/15/15   Historical Provider, MD  Vitamin D, Ergocalciferol, (DRISDOL) 50000 units CAPS capsule TK 1 C PO 1 TIME A WK 01/13/16   Historical Provider, MD    Family History Family History  Problem Relation Age of Onset  . Liver  cancer Mother   . Lung cancer Father   . Breast cancer Sister     stage 2, triple neg, BRCA pos  . Skin cancer Sister     2016    Social History Social History  Substance Use Topics  . Smoking status: Light Tobacco Smoker    Packs/day: 0.25    Years: 30.00    Types: Cigarettes    Last attempt to quit: 10/26/2015  . Smokeless tobacco: Never Used  . Alcohol use 0.0 oz/week     Comment: OCC     Allergies   Codeine and Macrobid [nitrofurantoin]   Review of Systems Review of Systems  Constitutional: Negative for diaphoresis and fever.  Respiratory: Positive for chest tightness. Negative for cough, shortness of breath and wheezing.   Cardiovascular: Positive for chest pain.  Gastrointestinal: Negative for abdominal pain.  Musculoskeletal: Negative for back pain.  All other systems reviewed and are  negative.    Physical Exam Triage Vital Signs ED Triage Vitals  Enc Vitals Group     BP 12/13/16 1448 111/76     Pulse Rate 12/13/16 1448 86     Resp --      Temp 12/13/16 1448 97.7 F (36.5 C)     Temp Source 12/13/16 1448 Oral     SpO2 12/13/16 1448 97 %     Weight 12/13/16 1454 138 lb (62.6 kg)     Height 12/13/16 1454 5' 4.5" (1.638 m)     Head Circumference --      Peak Flow --      Pain Score 12/13/16 1457 10     Pain Loc --      Pain Edu? --      Excl. in [place]? --    No data found.   Updated Vital Signs BP 111/76 (BP Location: Left Arm)   Pulse 86   Temp 97.7 F (36.5 C) (Oral)   Ht 5' 4.5" (1.638 m)   Wt 138 lb (62.6 kg)   SpO2 97%   BMI 23.32 kg/m   Visual Acuity Right Eye Distance:   Left Eye Distance:   Bilateral Distance:    Right Eye Near:   Left Eye Near:    Bilateral Near:     Physical Exam  Constitutional: She appears well-developed and well-nourished. No distress.  HENT:  Head: Atraumatic.  Right Ear: External ear normal.  Left Ear: External ear normal.  Nose: Nose normal.  Mouth/Throat: Oropharynx is clear and moist.  Eyes: Conjunctivae are normal. Pupils are equal, round, and reactive to light.  Neck: Normal range of motion.  Cardiovascular: Normal heart sounds.   Pulmonary/Chest: Breath sounds normal. No respiratory distress. She has no wheezes. She has no rales.     She exhibits tenderness.    Abdominal: There is no tenderness.  Musculoskeletal: She exhibits no edema.  Neurological: She is alert.  Skin: Skin is warm and dry.  Nursing note and vitals reviewed.    UC Treatments / Results  Labs (all labs ordered are listed, but only abnormal results are displayed) Labs Reviewed - No data to display  EKG  EKG Interpretation None       Radiology Dg Ribs Unilateral W/chest Left  Result Date: 12/13/2016 CLINICAL DATA:  Left rib pain after fall 2 days prior. EXAM: LEFT RIBS AND CHEST - 3+ VIEW COMPARISON:  None.  FINDINGS: Normal heart size. Normal mediastinal contour. No pneumothorax. No pleural effusion. Lungs appear clear, with no acute consolidative airspace disease  and no pulmonary edema. The area of symptomatic concern as indicated by the patient in the lateral lower left chest was denoted with a metallic skin BB by the technologist. No fracture or suspicious focal osseous lesion is seen in the left ribs. IMPRESSION: 1. No left rib fracture detected. Should the patient's symptoms persist or worsen, repeat radiographs of the ribs in 10 - 14 days maybe of use to detect subtle nondisplaced rib fractures (which are commonly occult on initial imaging). 2. No active cardiopulmonary disease. Electronically Signed   By: Ilona Sorrel M.D.   On: 12/13/2016 15:06    Procedures Procedures (including critical care time)  Medications Ordered in UC Medications  ketorolac (TORADOL) injection 60 mg (60 mg Intramuscular Given 12/13/16 1604)     Initial Impression / Assessment and Plan / UC Course  I have reviewed the triage vital signs and the nursing notes.  Pertinent labs & imaging results that were available during my care of the patient were reviewed by me and considered in my medical decision making (see chart for details).  Clinical Course   Administered Toradol 50m IM.  Dispensed rib belt. Begin Meloxicam 158mdaily. Percocet forpain HS. Apply ice pack for 20 to 30 minutes, 3 to 4 times daily  Continue until pain and swelling decrease. Wear rib belt sparingly.  If symptoms become significantly worse during the night or over the weekend, proceed to the local emergency room.  Followup with Family Doctor if not improved in two weeks.     Final Clinical Impressions(s) / UC Diagnoses   Final diagnoses:  Contusion of ribs, left, initial encounter    New Prescriptions New Prescriptions   MELOXICAM (MOBIC) 15 MG TABLET    Take 1 tablet (15 mg total) by mouth daily. Take with food each morning    OXYCODONE-ACETAMINOPHEN (ROXICET) 5-325 MG TABLET    Take one tab PO HS prn pain     StKandra NicolasMD 01/02/17 1931

## 2016-12-13 NOTE — ED Triage Notes (Signed)
Pt fell over the legs of her rocking chair and hit left rib area.  Pt having pain when moving and taking a deep breath.

## 2016-12-13 NOTE — Discharge Instructions (Signed)
Apply ice pack for 20 to 30 minutes, 3 to 4 times daily  Continue until pain and swelling decrease.  Wear rib belt sparingly.  If symptoms become significantly worse during the night or over the weekend, proceed to the local emergency room.  

## 2017-01-06 ENCOUNTER — Ambulatory Visit (INDEPENDENT_AMBULATORY_CARE_PROVIDER_SITE_OTHER): Payer: BLUE CROSS/BLUE SHIELD

## 2017-01-06 ENCOUNTER — Ambulatory Visit (INDEPENDENT_AMBULATORY_CARE_PROVIDER_SITE_OTHER): Payer: BLUE CROSS/BLUE SHIELD | Admitting: Sports Medicine

## 2017-01-06 ENCOUNTER — Encounter: Payer: Self-pay | Admitting: Sports Medicine

## 2017-01-06 DIAGNOSIS — S2242XD Multiple fractures of ribs, left side, subsequent encounter for fracture with routine healing: Secondary | ICD-10-CM

## 2017-01-06 DIAGNOSIS — W1800XD Striking against unspecified object with subsequent fall, subsequent encounter: Secondary | ICD-10-CM

## 2017-01-06 DIAGNOSIS — S299XXA Unspecified injury of thorax, initial encounter: Secondary | ICD-10-CM | POA: Insufficient documentation

## 2017-01-06 DIAGNOSIS — S299XXD Unspecified injury of thorax, subsequent encounter: Secondary | ICD-10-CM

## 2017-01-06 MED ORDER — OXYCODONE-ACETAMINOPHEN 10-325 MG PO TABS: 1.0000 | ORAL_TABLET | Freq: Three times a day (TID) | ORAL | 0 refills | Status: DC | PRN

## 2017-01-06 NOTE — Assessment & Plan Note (Signed)
Persistent and exquisite pain over the left rib cage, and mid thoracic spine. This is after a fall a month ago. Mild shortness of breath. I do see a possible defect in the left fourth rib. I'm going get a CT without contrast. Percocet 10 for pain. Return to see me in 2 weeks.

## 2017-01-06 NOTE — Progress Notes (Signed)
   Subjective:    I'm seeing this patient as a consultation for:    CC: "back and rib pain"  HPI: Patient is a 56yo female who presents to the clinic for persistent rib pain and back pain after a fall on 12/11/16.  Patient reports she was seen in urgent care 12/13/16, she was given mobic and perococet for rib pain, no fracture found.  Patient ranks the pain 6/10.  Patient states she has been taking Advil with no relief.  Denies using rib belt since it did not help after the acute event.  Patient states she has some relief with flexion and it is worse with extension.  Patient states the pain has been waking her up at night.  Denies numbness, tingling, or bruising.  Denies SOB, chest pain, cough, or recent fever.     Past medical history:  Negative.  See flowsheet/record as well for more information.  Surgical history: Negative.  See flowsheet/record as well for more information.  Family history: Negative.  See flowsheet/record as well for more information.  Social history: Negative.  See flowsheet/record as well for more information.  Allergies, and medications have been entered into the medical record, reviewed, and no changes needed.   Review of Systems: No headache, visual changes, nausea, vomiting, diarrhea, constipation, dizziness, abdominal pain, skin rash, fevers, chills, night sweats, weight loss, swollen lymph nodes, body aches, joint swelling, muscle aches, chest pain, shortness of breath, mood changes, visual or auditory hallucinations.   Objective:   General: Well Developed, well nourished, and in no acute distress.  Neuro/Psych: Alert and oriented x3, extra-ocular muscles intact, able to move all 4 extremities, sensation grossly intact. Skin: Warm and dry, no rashes noted.  Respiratory: Not using accessory muscles, speaking in full sentences, trachea midline.  Cardiovascular: Pulses palpable, no extremity edema. Abdomen: Does not appear distended. Back Exam:  Inspection:  Unremarkable  Motion: Flexion 45 deg, Extension 45 deg, Side Bending to 45 deg bilaterally,  Rotation to 45 deg bilaterally  Pain relief with flexion.  Significant pain with extension.   SLR laying: Negative  Palpable tenderness: left ribs  Midline tenderness in thoracic  Sensory change: Gross sensation intact to all lumbar and sacral dermatomes.  Reflexes: 2+ at both patellar tendons, 2+ at achilles tendons, Babinski's downgoing.  Strength at foot   Gait unremarkable.    Impression and Recommendations:   This case required medical decision making of moderate complexity.  No problem-specific Assessment & Plan notes found for this encounter.  Jasmine December.Bonney was seen today for fall.  Diagnoses and all orders for this visit:  Trauma of chest, subsequent encounter -     CT CHEST WO CONTRAST -     oxyCODONE-acetaminophen (PERCOCET) 10-325 MG tablet; Take 1 tablet by mouth every 8 (eight) hours as needed for pain.    Patient will get a CT of her chest, since a possible defect was noted on the x-ray performed on 12/13/16 and persistent pain since then.  Patient given percocet for pain relief.  Patient encouraged to continue to take deep breaths frequently.  Patient informed to hold off on NSAIDs in case this is a fracture, since they will delay healing time. Patient informed she can continue to ice her back for pain relief.

## 2017-01-20 ENCOUNTER — Ambulatory Visit: Payer: BLUE CROSS/BLUE SHIELD | Admitting: Sports Medicine

## 2017-04-14 IMAGING — DX DG TOE 5TH 2+V*R*
3 series · 3 of 3 positions shown · non-contrast
Comparison: 09/19/2016

CLINICAL DATA: Followup of fifth toe fracture.

EXAM:
RIGHT FIFTH TOE

[toe ap]
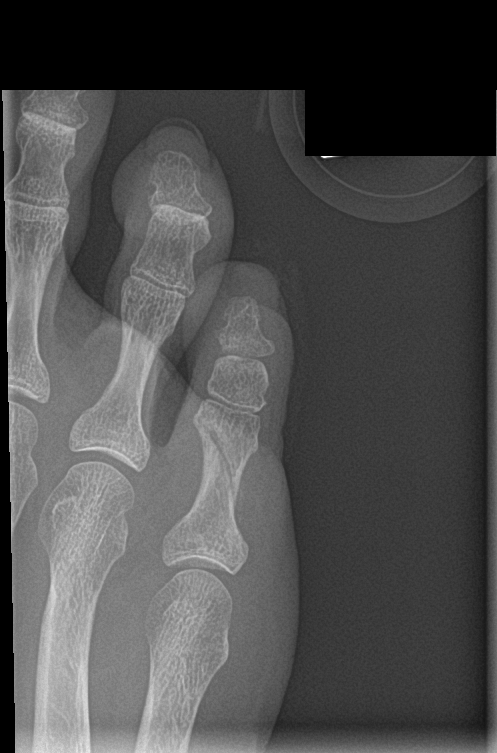

[toe obl]
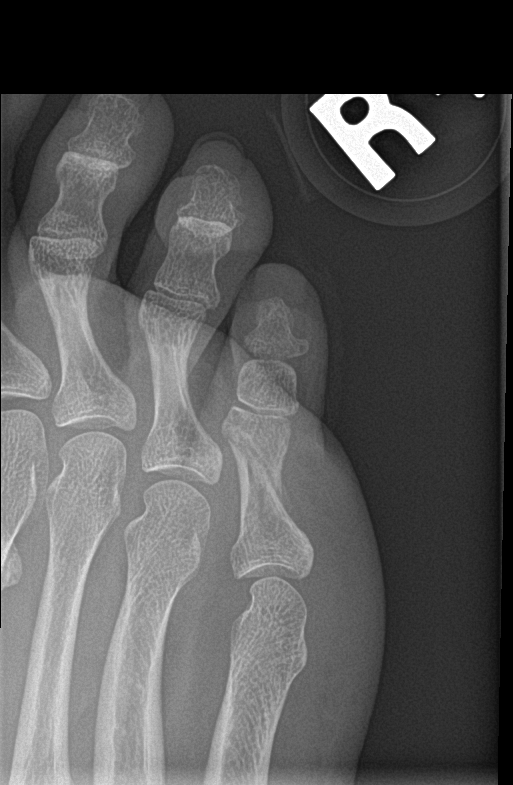

[toe lat]
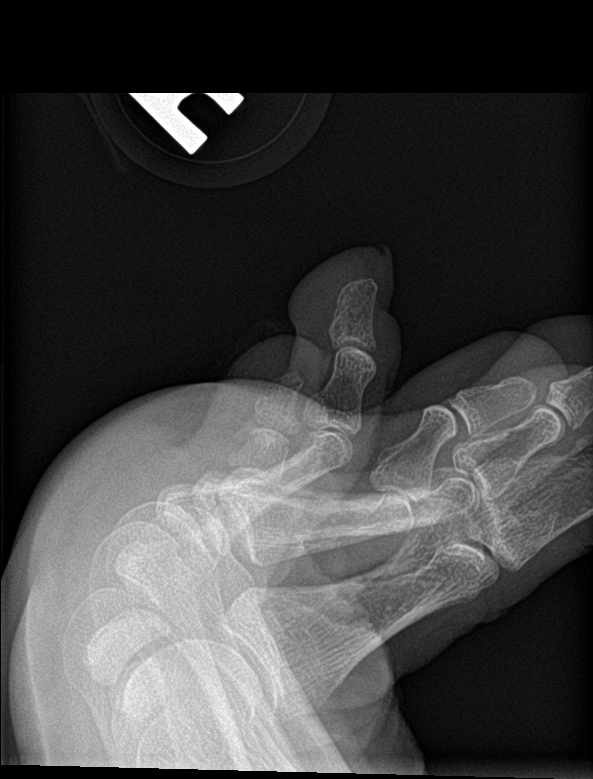

[3 of 3 positions shown; findings below may reference images not displayed]

FINDINGS: Oblique fracture involving the proximal phalanx of fifth digit is
slightly less well-defined today, indicative of interval healing. No
new fracture identified. Lateral view degraded. Alignment unchanged,
near anatomic.
IMPRESSION: Partial healing of proximal phalanx, fifth digit fracture.

## 2017-07-18 IMAGING — CT CT CHEST W/O CM
2 of 3 series · 15 of 36 positions shown, 18 images · non-contrast
Comparison: Chest/left rib radiographs dated 12/13/2016

CLINICAL DATA: Trauma to chest 4 weeks ago, possible left 4th rib
defect

EXAM:
CT CHEST WITHOUT CONTRAST
TECHNIQUE: Multidetector CT imaging of the chest was performed following the
standard protocol without IV contrast.

[Series 2: thorax · axial · 0.72mm/px · z∈[-341,-76]mm · 12 of 63 slices shown, 15 images]
[im 5/63  mediastinal]
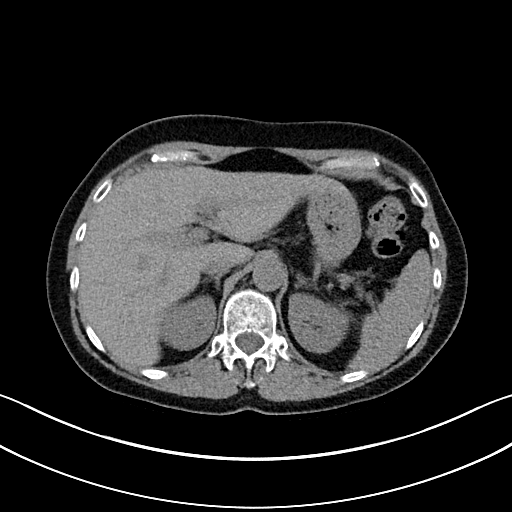
[im 5/63  lung]
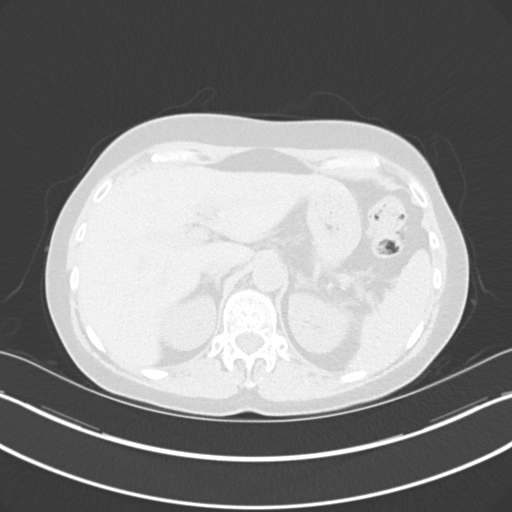
[im 10/63  lung]
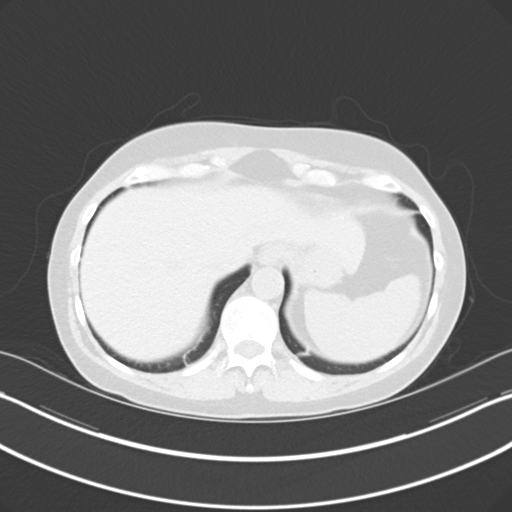
[im 14/63  lung]
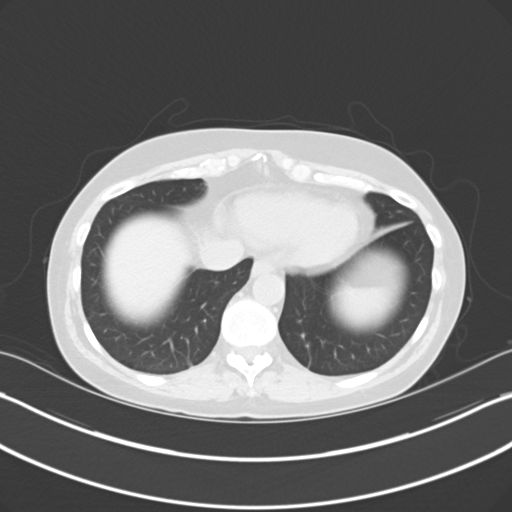
[im 19/63  lung]
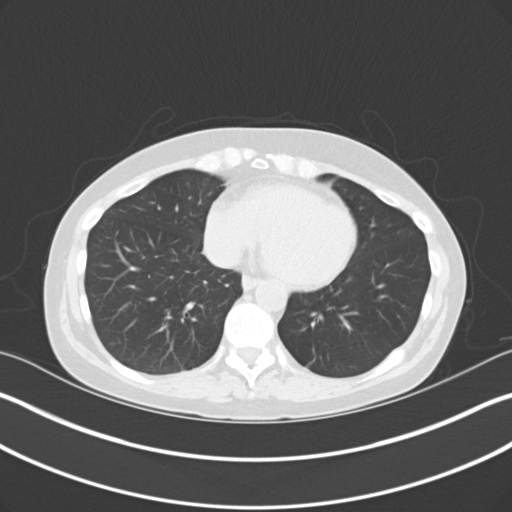
[im 23/63  mediastinal]
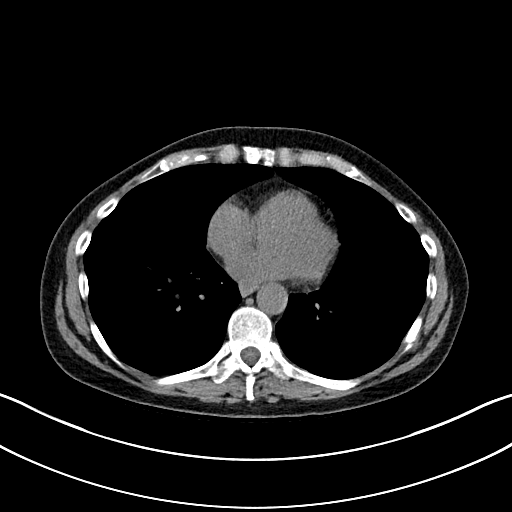
[im 23/63  lung]
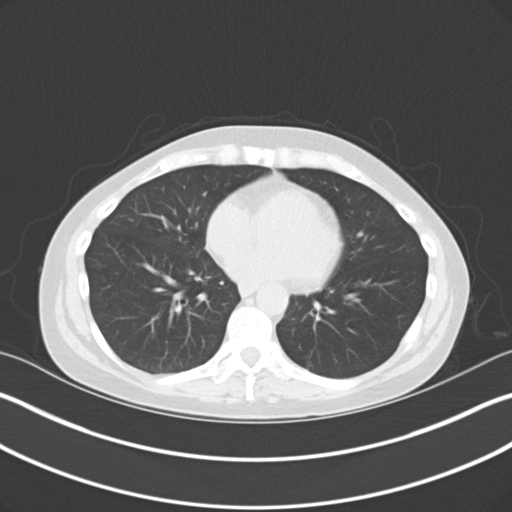
[im 28/63  lung]
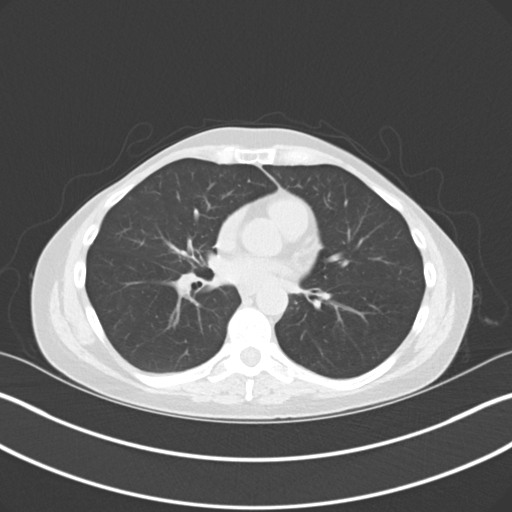
[im 35/63  lung]
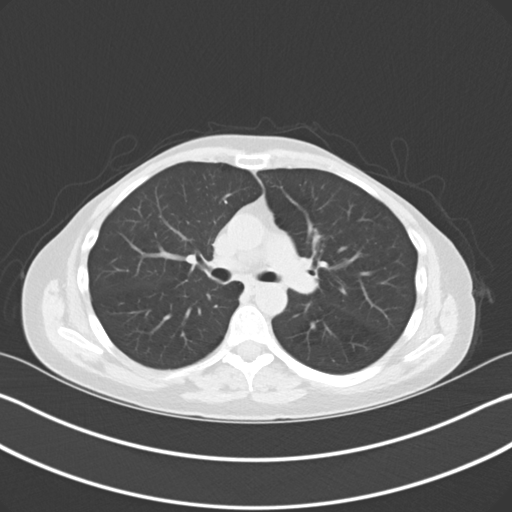
[im 40/63  lung]
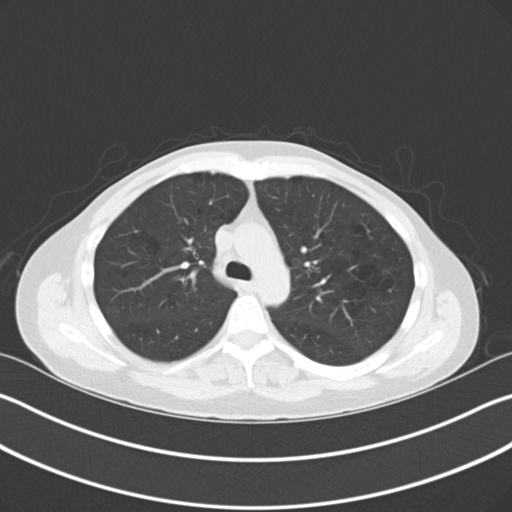
[im 44/63  mediastinal]
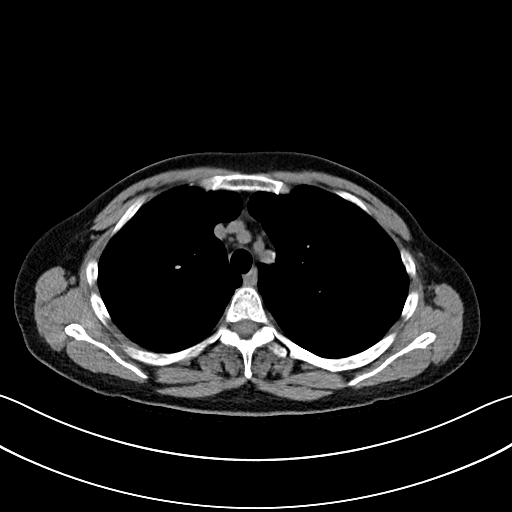
[im 44/63  lung]
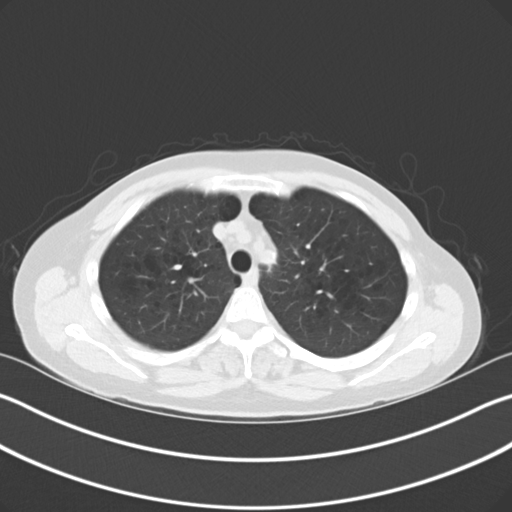
[im 49/63  lung]
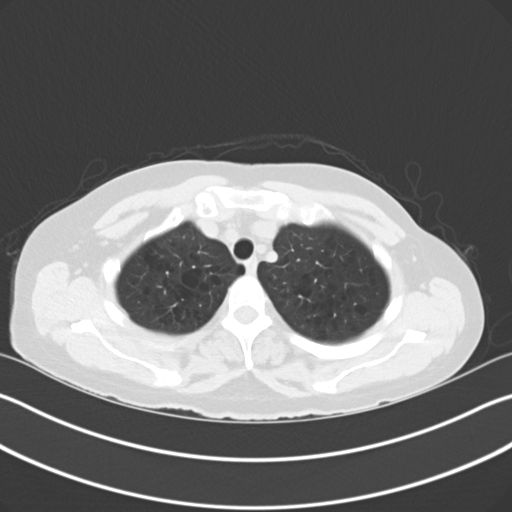
[im 53/63  lung]
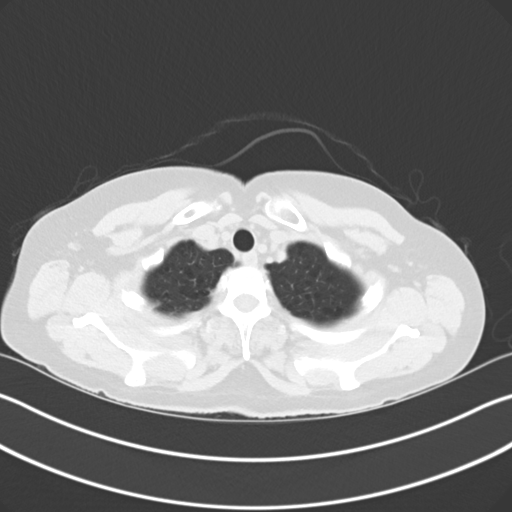
[im 58/63  lung]
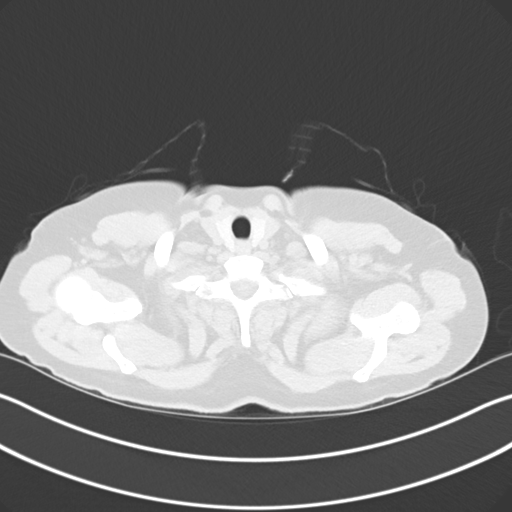

[Series 5: coronal · coronal · 0.66mm/px · 3 of 99 slices shown]
[im 20/99  lung]
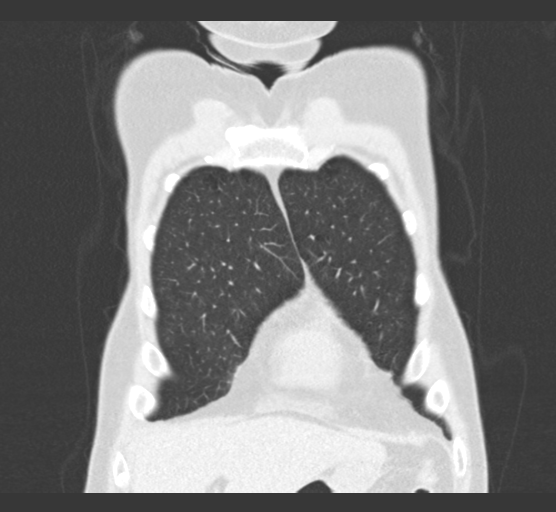
[im 40/99  lung]
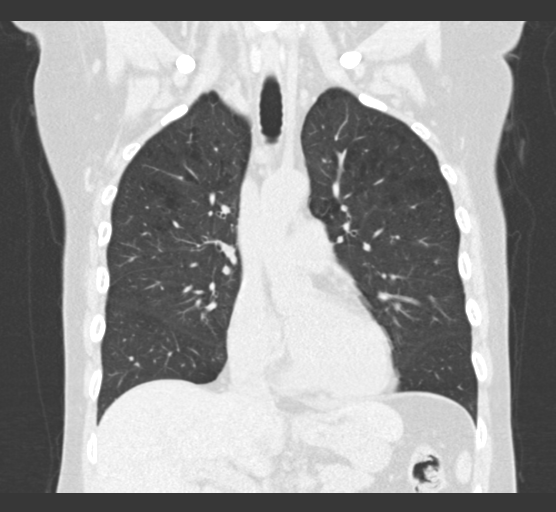
[im 59/99  lung]
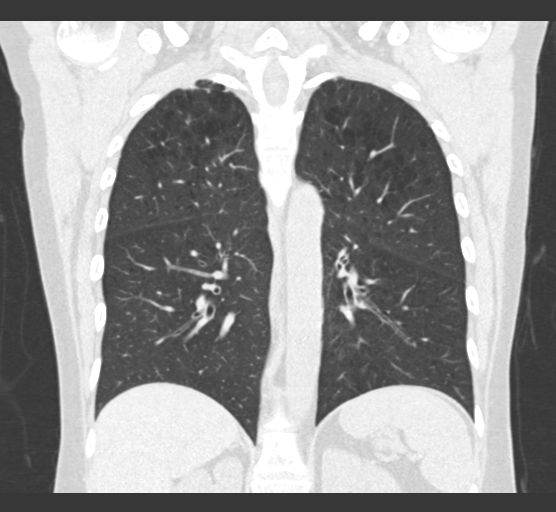

[15 of 36 positions shown; findings below may reference images not displayed]

FINDINGS: Cardiovascular: Heart is normal in size.  No pericardial effusion.

No evidence of thoracic aortic aneurysm.

Mediastinum/Nodes: Small mediastinal lymph nodes which do not meet
pathologic CT size criteria.

Visualized thyroid is unremarkable.

Lungs/Pleura: Mild centrilobular emphysematous changes, upper lobe
predominant.

Biapical pleural-parenchymal scarring.

No suspicious pulmonary nodules.

No focal consolidation.

No pleural effusion or pneumothorax.

Upper Abdomen: Visualized upper abdomen is unremarkable.

Musculoskeletal: Left posterolateral 8th and 9th rib fractures
(series 7/ images 40 and 46).

Old/healed left lateral 5th rib fracture deformity (series 7/ image
27).

No abnormality of the left 4th rib.
IMPRESSION: Left posterolateral 8th and 9th rib fractures.

No abnormality of the left 4th rib.

No pneumothorax.

## 2017-09-08 ENCOUNTER — Encounter: Payer: Self-pay | Admitting: Family Medicine

## 2017-09-08 ENCOUNTER — Ambulatory Visit (INDEPENDENT_AMBULATORY_CARE_PROVIDER_SITE_OTHER): Payer: BLUE CROSS/BLUE SHIELD | Admitting: Family Medicine

## 2017-09-08 VITALS — BP 103/63 | HR 87 | Wt 134.0 lb

## 2017-09-08 DIAGNOSIS — S39012A Strain of muscle, fascia and tendon of lower back, initial encounter: Secondary | ICD-10-CM | POA: Diagnosis not present

## 2017-09-08 MED ORDER — OXYCODONE-ACETAMINOPHEN 5-325 MG PO TABS: 1.0000 | ORAL_TABLET | Freq: Three times a day (TID) | ORAL | 0 refills | Status: DC | PRN

## 2017-09-08 NOTE — Patient Instructions (Addendum)
Thank you for coming in today. Take ibuprofen for pain  Continue baclofen as needed.  Use oxycodone for severe pain sparingly/.  Attend PT.  Recheck with me in 3 weeks if not better.   Come back or go to the emergency room if you notice new weakness new numbness problems walking or bowel or bladder problems.  Lumbosacral strain   Lumbosacral Strain Lumbosacral strain is an injury that causes pain in the lower back (lumbosacral spine). This injury usually occurs from overstretching the muscles or ligaments along your spine. A strain can affect one or more muscles or cord-like tissues that connect bones to other bones (ligaments). What are the causes? This condition may be caused by:  A hard, direct hit (blow) to the back.  Excessive stretching of the lower back muscles. This may result from: ? A fall. ? Lifting something heavy. ? Repetitive movements such as bending or crouching.  What increases the risk? The following factors may increase your risk of getting this condition:  Participating in sports or activities that involve: ? A sudden twist of the back. ? Pushing or pulling motions.  Being overweight or obese.  Having poor strength and flexibility, especially tight hamstrings or weak muscles in the back or abdomen.  Having too much of a curve in the lower back.  Having a pelvis that is tilted forward.  What are the signs or symptoms? The main symptom of this condition is pain in the lower back, at the site of the strain. Pain may extend (radiate) down one or both legs. How is this diagnosed? This condition is diagnosed based on:  Your symptoms.  Your medical history.  A physical exam. ? Your health care provider may push on certain areas of your back to determine the source of your pain. ? You may be asked to bend forward, backward, and side to side to assess the severity of your pain and your range of motion.  Imaging tests, such as: ? X-rays. ? MRI.  How  is this treated? Treatment for this condition may include:  Putting heat and cold on the affected area.  Medicines to help relieve pain and relax your muscles (muscle relaxants).  NSAIDs to help reduce swelling and discomfort.  When your symptoms improve, it is important to gradually return to your normal routine as soon as possible to reduce pain, avoid stiffness, and avoid loss of muscle strength. Generally, symptoms should improve within 6 weeks of treatment. However, recovery time varies. Follow these instructions at home: Managing pain, stiffness, and swelling   If directed, put ice on the injured area during the first 24 hours after your strain. ? Put ice in a plastic bag. ? Place a towel between your skin and the bag. ? Leave the ice on for 20 minutes, 2-3 times a day.  If directed, put heat on the affected area as often as told by your health care provider. Use the heat source that your health care provider recommends, such as a moist heat pack or a heating pad. ? Place a towel between your skin and the heat source. ? Leave the heat on for 20-30 minutes. ? Remove the heat if your skin turns bright red. This is especially important if you are unable to feel pain, heat, or cold. You may have a greater risk of getting burned. Activity  Rest and return to your normal activities as told by your health care provider. Ask your health care provider what activities are safe for  you.  Avoid activities that take a lot of energy for as long as told by your health care provider. General instructions  Take over-the-counter and prescription medicines only as told by your health care provider.  Donot drive or use heavy machinery while taking prescription pain medicine.  Do not use any products that contain nicotine or tobacco, such as cigarettes and e-cigarettes. If you need help quitting, ask your health care provider.  Keep all follow-up visits as told by your health care provider.  This is important. How is this prevented?  Use correct form when playing sports and lifting heavy objects.  Use good posture when sitting and standing.  Maintain a healthy weight.  Sleep on a mattress with medium firmness to support your back.  Be safe and responsible while being active to avoid falls.  Do at least 150 minutes of moderate-intensity exercise each week, such as brisk walking or water aerobics. Try a form of exercise that takes stress off your back, such as swimming or stationary cycling.  Maintain physical fitness, including: ? Strength. ? Flexibility. ? Cardiovascular fitness. ? Endurance. Contact a health care provider if:  Your back pain does not improve after 6 weeks of treatment.  Your symptoms get worse. Get help right away if:  Your back pain is severe.  You cannot stand or walk.  You have difficulty controlling when you urinate or when you have a bowel movement.  You feel nauseous or you vomit.  Your feet get very cold.  You have numbness, tingling, weakness, or problems using your arms or legs.  You develop any of the following: ? Shortness of breath. ? Dizziness. ? Pain in your legs. ? Weakness in your buttocks or legs. ? Discoloration of the skin on your toes or legs. This information is not intended to replace advice given to you by your health care provider. Make sure you discuss any questions you have with your health care provider. Document Released: 09/07/2005 Document Revised: 06/17/2016 Document Reviewed: 05/01/2016 Elsevier Interactive Patient Education  2017 [employer].

## 2017-09-08 NOTE — Progress Notes (Signed)
Candice Henderson is a 56 y.o. female who presents to [facility] today for back pain Patient notes a one-day history of right-sided low back pain. The pain does not radiate. The pain started after Tharon was pulling clean clothing from the dryer and stood up. She denies any weakness or numbness. She has pain is quite severe at times. She has tried some baclofen which is helped a bit. Additionally she's tried ibuprofen which helps some. The past she's done well with hydrocodone or oxycodone.  Past Medical History:  Diagnosis Date  . BRCA2 positive   . Hyperlipidemia    Past Surgical History:  Procedure Laterality Date  . ABDOMINAL HYSTERECTOMY    . BREAST SURGERY Bilateral November 20, 2015   Bilateral prophylactic simple mastectomy  . CARPAL TUNNEL RELEASE  15 years ago   Thumb joint reconstruction  . CESAREAN SECTION    . COLONOSCOPY  2017   [place]  . LAPAROSCOPIC OOPHERECTOMY    . SIMPLE MASTECTOMY WITH AXILLARY SENTINEL NODE BIOPSY Bilateral 11/20/2015   Procedure: SIMPLE MASTECTOMY;  Surgeon: Robert Bellow, MD;  Location: [facility] ORS;  Service: General;  Laterality: Bilateral;  . TUBAL LIGATION     Social History  Substance Use Topics  . Smoking status: Light Tobacco Smoker    Packs/day: 0.25    Years: 30.00    Types: Cigarettes    Last attempt to quit: 10/26/2015  . Smokeless tobacco: Never Used  . Alcohol use 0.0 oz/week     Comment: OCC     ROS:  As above   Medications: Current Outpatient Prescriptions  Medication Sig Dispense Refill  . citalopram (CELEXA) 40 MG tablet TK 1 T PO D  1  . clonazePAM (KLONOPIN) 1 MG tablet TK 1 T PO BID PRA  0  . ibuprofen (ADVIL,MOTRIN) 800 MG tablet Take 800 mg by mouth 3 (three) times daily.    . pravastatin (PRAVACHOL) 40 MG tablet TK 1 T PO Q EVENING  1  . Vitamin D, Ergocalciferol, (DRISDOL) 50000 units CAPS capsule TK 1 C PO 1 TIME A WK  0  . oxyCODONE-acetaminophen  (PERCOCET/ROXICET) 5-325 MG tablet Take 1 tablet by mouth every 8 (eight) hours as needed for severe pain. 12 tablet 0   No current facility-administered medications for this visit.    Allergies  Allergen Reactions  . Amoxicillin Anaphylaxis    Rash and Vomiting  . Codeine Nausea And Vomiting and Rash  . Macrobid [Nitrofurantoin] Nausea And Vomiting and Rash     Exam:  BP 103/63   Pulse 87   Wt 134 lb (60.8 kg)   BMI 22.65 kg/m  General: Well Developed, well nourished, and in no acute distress.  Neuro/Psych: Alert and oriented x3, extra-ocular muscles intact, able to move all 4 extremities, sensation grossly intact. Skin: Warm and dry, no rashes noted.  Respiratory: Not using accessory muscles, speaking in full sentences, trachea midline.  Cardiovascular: Pulses palpable, no extremity edema. Abdomen: Does not appear distended. MSK: L spine: Nontender to spinal midline. Tender palpation right lumbar paraspinal muscles. Range of motion is significantly limited by pain. Lower extremity strength is equal and normal throughout. Negative slump test bilaterally. Reflexes are intact throughout.     No results found for this or any previous visit (from the past 48 hour(s)). No results found.    Assessment and Plan: 56 y.o. female with  lumbosacral strain. Plan to treat with continued baclofen, as well as referral to  physical therapy and prescription strength NSAIDs. Recommend heating pad and TENS unit. We'll use small amount of low-dose oxycodone for pain control. Patient researched [facility] Controlled Substance Reporting System.     Orders Placed This Encounter  Procedures  . Ambulatory referral to Physical Therapy    Referral Priority:   Routine    Referral Type:   Physical Medicine    Referral Reason:   Specialty Services Required    Requested Specialty:   Physical Therapy   Meds ordered this encounter  Medications  . oxyCODONE-acetaminophen (PERCOCET/ROXICET)  5-325 MG tablet    Sig: Take 1 tablet by mouth every 8 (eight) hours as needed for severe pain.    Dispense:  12 tablet    Refill:  0    Discussed warning signs or symptoms. Please see discharge instructions. Patient expresses understanding.

## 2018-01-22 ENCOUNTER — Ambulatory Visit (INDEPENDENT_AMBULATORY_CARE_PROVIDER_SITE_OTHER): Payer: BLUE CROSS/BLUE SHIELD | Admitting: Orthopaedic Surgery

## 2018-01-22 ENCOUNTER — Ambulatory Visit (INDEPENDENT_AMBULATORY_CARE_PROVIDER_SITE_OTHER): Payer: Self-pay

## 2018-01-22 ENCOUNTER — Encounter (INDEPENDENT_AMBULATORY_CARE_PROVIDER_SITE_OTHER): Payer: Self-pay | Admitting: Orthopaedic Surgery

## 2018-01-22 DIAGNOSIS — M25531 Pain in right wrist: Secondary | ICD-10-CM | POA: Insufficient documentation

## 2018-01-22 DIAGNOSIS — M545 Low back pain, unspecified: Secondary | ICD-10-CM | POA: Insufficient documentation

## 2018-01-22 NOTE — Progress Notes (Signed)
Office Visit Note   Patient: Candice Henderson           Date of Birth: December 14, 1960           MRN: [id number] Visit Date: 01/22/2018              Requested by: Candice Chess, NP [address] [place], [place] [address] PCP: Candice Chess, NP   Assessment & Plan: Visit Diagnoses:  1. Pain in right wrist   2. Low back pain, unspecified back pain laterality, unspecified chronicity, with sciatica presence unspecified     Plan: In regards to the lower back, there is no apparent fracture.  The patient states that she has a course at home so she will start wearing that for the next few weeks.  In regards to the right wrist, I think she has a few things going on.  I think she has Declaire veins tenosynovitis, sesamoiditis and irritation around the [facility] joint.  Today, we injected her [place] joint with cortisone.  I am hopeful this will help.  Also provide her with a thumb spica removable splint.  She will call in the next few weeks if she is not any better.  I told her that we could inject her sesamoid.  Follow-Up Instructions: Return if symptoms worsen or fail to improve.   Orders:  Orders Placed This Encounter  Procedures  . Hand/UE Inj: R thumb CMC  . XR Wrist Complete Right  . XR Lumbar Spine 2-3 Views   No orders of the defined types were placed in this encounter.     Procedures: Hand/UE Inj: R thumb CMC for osteoarthritis on 01/22/2018 5:06 PM Medications: 3 mL lidocaine 1 %; 0.33 mL bupivacaine 0.25 %; 13.33 mg methylPREDNISolone acetate 40 MG/ML      Clinical Data: No additional findings.   Subjective: Chief Complaint  Patient presents with  . Right Wrist - Pain  . Lower Back - Pain    HPI Candice Henderson is a pleasant 57 year old female new patient who presents to our clinic today with lower back pain and right wrist pain.  In regards to her lower back this pain began little over a month ago when she fell over a chair.  She was seen by her primary care x-rays were obtained of  her ribs.  The pain in the middle of her lower back has persisted.  This is worse with flexion extension and rotation.  No previous injury to the lumbar spine.  She is tried oral anti-inflammatories with minimal relief of symptoms.  No numbness tingling burning.  No saddle paresthesias or bowel or bladder change.  In regards to her right wrist, she has marked pain over the first dorsal compartment as well as the base of the thumb.  Pain is worse with any motion of the thumb/wrist.  She she is also starting to get a fair amount of weakness.  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  Of note, she does have a history from a [facility] fusion with tendon transfer about 18 years ago by a physician at prenatal clinic in [place].  She was doing well until the past 2 weeks  When this began.  No numbness tingling or burning.  Review of Systems as detailed in HPI.  All others reviewed and are negative.   Objective: Vital Signs: There were no vitals taken for this visit.  Physical Exam well-developed well-nourished female no acute distress.  Alert and oriented x3.  Ortho Exam examination of her lumbar spine reveals marked spinous and paraspinous tenderness L2-3.  She has increased pain with flexion, extension, rotation of the back.  Negative straight leg raise both sides.  Examination of the right wrist reveals limited range of motion of the thumb.  Marked tenderness over the first dorsal compartment as well as the [facility] joint.  Positive Finkelstein positive grind.  She does have what feels like a sesamoid that is tender and palpable over the base of the thumb.  She is neurovascular intact distally.  Specialty Comments:  No specialty comments available.  Imaging: Xr Wrist Complete Right  Result Date: 01/22/2018 X-rays of the wrist reveal previous resection of the phalanx and trapezium from the [facility] fusion.  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  Xr Lumbar Spine 2-3  Views  Result Date: 01/22/2018 X-rays of the lumbar spine are negative for structural abnormalities.    PMFS History: Patient Active Problem List   Diagnosis Date Noted  . Low back pain 01/22/2018  . Pain in right wrist 01/22/2018  . Chest trauma 01/06/2017  . Swelling of first metatarsophalangeal (MTP) joint of left foot 10/03/2016  . Closed displaced fracture of proximal phalanx of lesser toe of right foot 09/12/2016  . BRCA2 positive 10/07/2015   Past Medical History:  Diagnosis Date  . BRCA2 positive   . Hyperlipidemia     Family History  Problem Relation Age of Onset  . Liver cancer Mother   . Lung cancer Father   . Breast cancer Sister        stage 2, triple neg, BRCA pos  . Skin cancer Sister        2016    Past Surgical History:  Procedure Laterality Date  . ABDOMINAL HYSTERECTOMY    . BREAST SURGERY Bilateral November 20, 2015   Bilateral prophylactic simple mastectomy  . CARPAL TUNNEL RELEASE  15 years ago   Thumb joint reconstruction  . CESAREAN SECTION    . COLONOSCOPY  2017   [place]  . LAPAROSCOPIC OOPHERECTOMY    . SIMPLE MASTECTOMY WITH AXILLARY SENTINEL NODE BIOPSY Bilateral 11/20/2015   Procedure: SIMPLE MASTECTOMY;  Surgeon: Robert Bellow, MD;  Location: [facility] ORS;  Service: General;  Laterality: Bilateral;  . TUBAL LIGATION     Social History   Occupational History  . Not on file  Tobacco Use  . Smoking status: Light Tobacco Smoker    Packs/day: 0.25    Years: 30.00    Pack years: 7.50    Types: Cigarettes    Last attempt to quit: 10/26/2015    Years since quitting: 2.2  . Smokeless tobacco: Never Used  Substance and Sexual Activity  . Alcohol use: Yes    Alcohol/week: 0.0 oz    Comment: OCC  . Drug use: No  . Sexual activity: Yes

## 2018-01-23 MED ORDER — METHYLPREDNISOLONE ACETATE 40 MG/ML IJ SUSP
13.3300 mg | INTRAMUSCULAR | Status: AC | PRN
  Administered 2018-01-22: 13.33 mg

## 2018-01-23 MED ORDER — BUPIVACAINE HCL 0.25 % IJ SOLN
0.3300 mL | INTRAMUSCULAR | Status: AC | PRN
  Administered 2018-01-22: .33 mL

## 2018-01-23 MED ORDER — LIDOCAINE HCL 1 % IJ SOLN
3.0000 mL | INTRAMUSCULAR | Status: AC | PRN
  Administered 2018-01-22: 3 mL

## 2018-02-27 ENCOUNTER — Ambulatory Visit (INDEPENDENT_AMBULATORY_CARE_PROVIDER_SITE_OTHER): Payer: BLUE CROSS/BLUE SHIELD | Admitting: Certified Nurse Midwife

## 2018-02-27 ENCOUNTER — Encounter: Payer: Self-pay | Admitting: Certified Nurse Midwife

## 2018-02-27 VITALS — BP 100/60 | HR 71 | Ht 65.0 in | Wt 125.0 lb

## 2018-02-27 DIAGNOSIS — F419 Anxiety disorder, unspecified: Secondary | ICD-10-CM

## 2018-02-27 DIAGNOSIS — Z01419 Encounter for gynecological examination (general) (routine) without abnormal findings: Secondary | ICD-10-CM

## 2018-02-27 DIAGNOSIS — Z1509 Genetic susceptibility to other malignant neoplasm: Secondary | ICD-10-CM | POA: Diagnosis not present

## 2018-02-27 DIAGNOSIS — Z1501 Genetic susceptibility to malignant neoplasm of breast: Secondary | ICD-10-CM

## 2018-02-27 DIAGNOSIS — Z87898 Personal history of other specified conditions: Secondary | ICD-10-CM

## 2018-02-27 DIAGNOSIS — Z1272 Encounter for screening for malignant neoplasm of vagina: Secondary | ICD-10-CM | POA: Diagnosis not present

## 2018-02-27 DIAGNOSIS — E785 Hyperlipidemia, unspecified: Secondary | ICD-10-CM | POA: Insufficient documentation

## 2018-02-27 DIAGNOSIS — Z8742 Personal history of other diseases of the female genital tract: Secondary | ICD-10-CM

## 2018-02-27 NOTE — Progress Notes (Signed)
    Gynecology Annual Exam  PCP: Elkins, Donnay, NP  Chief Complaint:  Chief Complaint  Patient presents with  . Gynecologic Exam    History of Present Illness:Axel B. Gershman presents today for her annual exam. She is a 57 year old Caucasian/White female , G 1 P 1 0 0 1 , who is BRCA 2 positive. Her twin sister is also BRCA positive and has had bilateral breast cancer.  Her menses are absent due to a TAH/LSO for AUB in 2002 and a RSO in 2014 ( done after BRCa2 positive)..  She has had no spotting. She has had some heat intolerance, but no night sweats.  The patient's past medical history is notable for a history of anxiety, abnormal Pap smears in 2009/2010, osreopenia, and hyperlipidemia. Since her last annual GYN exam dated 01/06/2017,. She has separated from her husband. She is not currently sexually active. She has been noticing some vaginal discharge recently. No odor or vulvovaginal itching or irritation.  Her most recent pap smear was obtained 01/06/2017 and was normal.  Her most recent mammogram obtained on 09/18/2015 was normal. Because she has had bilateral prophylactic mastectomies, she no longer needs mammograms or MRIs  She had a colonoscopy in 2016 that was normal. Her next colonoscopy is due in 10 years.  She had a recent DEXA scan obtained in 2016 that showed osteopenia.  The patient smokes 0.25 PPD.  The patient does drink occasionally.  The patient does not use illegal drugs.  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 She stopped taking her calcium supplement. She has her lab work and Dexa scans ordered by her PCP in [place] Donnay Elkins..       Review of Systems: Review of Systems  Constitutional: Negative for chills, fever and weight loss.  HENT: Negative for congestion, sinus pain and sore throat.   Eyes: Negative for blurred vision and pain.  Respiratory: Negative for  hemoptysis, shortness of breath and wheezing.   Cardiovascular: Negative for chest pain, palpitations and leg swelling.  Gastrointestinal: Negative for abdominal pain, blood in stool, diarrhea, heartburn, nausea and vomiting.  Genitourinary: Negative for dysuria, frequency, hematuria and urgency.       Positive for vaginal discharge  Musculoskeletal: Positive for joint pain (right thumb). Negative for back pain and myalgias.  Skin: Negative for itching and rash.  Neurological: Negative for dizziness, tingling and headaches.  Endo/Heme/Allergies: Negative for environmental allergies and polydipsia. Does not bruise/bleed easily.       Negative for hirsutism; positive for heat intolerance   Psychiatric/Behavioral: Negative for depression. The patient is nervous/anxious. The patient does not have insomnia.     Past Medical History:  Past Medical History:  Diagnosis Date  . Abnormal Pap smear of vagina 11/2008   ASCUS-H, 06/10/2009 LGSIL  . Anxiety   . BRCA2 positive   . Family history of ovarian cancer   . Herpes genitalis   . Hyperlipidemia   . Interstitial cystitis   . Osteopenia     Past Surgical History:  Past Surgical History:  Procedure Laterality Date  . ABDOMINAL HYSTERECTOMY  02/27/2001   TAH, LSO  . BREAST SURGERY Bilateral November 20, 2015   Bilateral prophylactic simple mastectomy  . CARPAL TUNNEL RELEASE  15 years ago   Thumb joint reconstruction  . CESAREAN SECTION    . COLONOSCOPY  08/2015   [place]  . CRYOTHERAPY    .   DILATION AND CURETTAGE, DIAGNOSTIC / THERAPEUTIC  05/14/1993  . LAPAROSCOPIC OOPHERECTOMY Right 2014   RSO  . SIMPLE MASTECTOMY WITH AXILLARY SENTINEL NODE BIOPSY Bilateral 11/20/2015   Procedure: SIMPLE MASTECTOMY;  Surgeon: Robert Bellow, MD;  Location: [facility] ORS;  Service: General;  Laterality: Bilateral;  . TUBAL LIGATION  05/14/1998    Family History:  Family History  Problem Relation Age of Onset  . Liver cancer Mother 44  .  Cervical cancer Mother   . Lung cancer Father 51       smoker  . Skin cancer Father        basal cell  . Hypertension Father   . Skin cancer Sister 51  . Breast cancer Sister 39       stage 2, triple neg, BRCA pos  . Skin cancer Sister        2016  . Ovarian cancer Maternal Grandmother   . Esophageal cancer Paternal Grandmother   . Pancreatic cancer Paternal Grandfather   . Prostate cancer Paternal Grandfather   . Breast cancer Paternal Aunt     Social History:  Social History   Socioeconomic History  . Marital status: Legally Separated    Spouse name: Not on file  . Number of children: 1  . Years of education: Not on file  . Highest education level: Not on file  Social Needs  . Financial resource strain: Not on file  . Food insecurity - worry: Not on file  . Food insecurity - inability: Not on file  . Transportation needs - medical: Not on file  . Transportation needs - non-medical: Not on file  Occupational History  . Occupation: [employer]  Tobacco Use  . Smoking status: Light Tobacco Smoker    Packs/day: 0.25    Years: 30.00    Pack years: 7.50    Types: Cigarettes    Last attempt to quit: 10/26/2015    Years since quitting: 2.3  . Smokeless tobacco: Never Used  . Tobacco comment: quit 2016  then recently restarted smoking  Substance and Sexual Activity  . Alcohol use: Yes    Alcohol/week: 0.0 oz    Comment: OCC  . Drug use: No  . Sexual activity: Not Currently    Birth control/protection: Surgical  Other Topics Concern  . Not on file  Social History Narrative  . Not on file    Allergies:  Allergies  Allergen Reactions  . Amoxicillin Anaphylaxis and Palpitations    Rash and Vomiting hot flash, flushed, diarrhea, vomiting , heart racing, diaphoresis   . Codeine Nausea And Vomiting and Rash  . Macrobid [Nitrofurantoin] Nausea And Vomiting and Rash    Medications: Current Outpatient Medications on File Prior to Visit  Medication  Sig Dispense Refill  . Cholecalciferol (VITAMIN D) 2000 units CAPS Take by mouth.    . citalopram (CELEXA) 40 MG tablet TK 1 T PO D  1  . clonazePAM (KLONOPIN) 1 MG tablet TK 1 T PO BID PRA  0  . ibuprofen (ADVIL,MOTRIN) 800 MG tablet Take 800 mg by mouth 3 (three) times daily.    . pravastatin (PRAVACHOL) 40 MG tablet TK 1 T PO Q EVENING  1   No current facility-administered medications on file prior to visit.   Physical Exam Vitals: BP 100/60   Pulse 71   Ht 5' 5" (1.651 m)   Wt 125 lb (56.7 kg)   BMI 20.80 kg/m   General: WF in NAD HEENT: normocephalic, anicteric  Neck: no thyroid enlargement, no palpable nodules, no cervical lymphadenopathy  Pulmonary: No increased work of breathing, CTAB Cardiovascular: RRR, without murmur  Breast: s/p bilateral mastectomies. Scars well healed. No axillary, infraclavicular or supraclavicular lymphadenopathy. Abdomen: Soft, non-tender, non-distended.  Umbilicus without lesions.  No hepatomegaly or masses palpable. No evidence of hernia. Genitourinary  External: Normal external female genitalia.  Normal urethral meatus, normal Bartholin's and Skene's glands.    Vagina: Normal vaginal mucosa, no evidence of prolapse, white mucoepithelial discharge   Cervix: surgically absent  Uterus: surgically absent  Adnexa: No adnexal masses, non-tender  Rectal: deferred  Lymphatic: no evidence of inguinal lymphadenopathy Extremities: no edema, erythema, or tenderness Neurologic: Grossly intact Psychiatric: mood appropriate, affect full     Assessment: 57 y.o. G1P1 annual gyn exam  Plan.  1) Cervical cancer screening - Pap was done. ASCCP guidelines and rational discussed.  Patient opts for yearly screening interval  2) Colonoscopy-UTD  3) Routine healthcare maintenance including cholesterol and diabetes screening managed by PCP   4) Discussed calcium and vitamin D3 requirements and the role of exercise in preventing osteoporosis.  5) RTO 1 year  and prn  Dalia Heading, CNM

## 2018-03-02 LAB — IGP,RFX APTIMA HPV ALL PTH: PAP Smear Comment: 0

## 2019-02-28 ENCOUNTER — Ambulatory Visit (INDEPENDENT_AMBULATORY_CARE_PROVIDER_SITE_OTHER): Payer: PRIVATE HEALTH INSURANCE | Admitting: Family Medicine

## 2019-02-28 ENCOUNTER — Other Ambulatory Visit: Payer: Self-pay

## 2019-02-28 ENCOUNTER — Encounter (INDEPENDENT_AMBULATORY_CARE_PROVIDER_SITE_OTHER): Payer: Self-pay | Admitting: Family Medicine

## 2019-02-28 DIAGNOSIS — M25552 Pain in left hip: Secondary | ICD-10-CM

## 2019-02-28 MED ORDER — TIZANIDINE HCL 2 MG PO TABS: 2.0000 mg | ORAL_TABLET | Freq: Every evening | ORAL | 1 refills | Status: AC | PRN

## 2019-02-28 MED ORDER — METHYLPREDNISOLONE ACETATE 40 MG/ML IJ SUSP: 40.0000 mg | Freq: Once | INTRAMUSCULAR | Status: AC

## 2019-02-28 NOTE — Progress Notes (Signed)
Office Visit Note   Patient: Candice Henderson           Date of Birth: [birth year]           MRN: [id number] Visit Date: 02/28/2019 Requested by: Zara Chess, NP [address] [place], [place] [address] PCP: Zara Chess, NP  Subjective: Chief Complaint  Patient presents with  . Left Hip - Pain    Pain starting in mid lower back, around hip to left groin x 1 week. NKI. Had this same issue about 12 years ago - came here & had steroid injections - helped until now.    HPI: She is here with left posterior hip pain.  Symptoms started about a week ago, possibly from doing housekeeping activities.  Pain was mild at first but now it is severe and is radiating into the groin area.  No pain down the leg.  She has had this problem in the past and we have injected her SI joint area blindly as well as fluoroscopically.  She got relief with both of those and has not had troubles for many years.  She is requesting an injection again.  Incidentally, she quit smoking a year ago with Chantix.              ROS: Denies fevers, chills, night sweats.  All other systems were reviewed and are negative.  Objective: Vital Signs: There were no vitals taken for this visit.  Physical Exam:  General:  Alert and oriented, in no acute distress. Pulm:  Breathing unlabored. Psy:  Normal mood, congruent affect. Skin: No rash on her skin. Left hip: She is very tender to the lateral aspect of the left SI joint, she has a symptomatic trigger point in this region.  No pain in the sciatic notch, no tenderness along the lumbar spinous processes.  No tenderness over the greater trochanter and no groin pain with passive hip flexion and internal rotation.  Her range of motion is very good.  Lower extremity strength and reflexes are normal.  Imaging: None today.  Assessment & Plan: 1.  Left posterior hip pain, probably myofascial versus SI dysfunction. -Discussed options with her and elected  to inject with cortisone today.  Follow-up as needed.     Procedures: Left sacroiliac area trigger point injection: After sterile prep with Betadine, injected 5 cc 1% lidocaine without epinephrine and 40 mg of methylprednisolone into the symptomatic trigger point without complication.    PMFS History: Patient Active Problem List   Diagnosis Date Noted  . Anxiety   . Hyperlipidemia   . Low back pain 01/22/2018  . Pain in right wrist 01/22/2018  . Chest trauma 01/06/2017  . Swelling of first metatarsophalangeal (MTP) joint of left foot 10/03/2016  . Closed displaced fracture of proximal phalanx of lesser toe of right foot 09/12/2016  . BRCA2 positive 10/07/2015   Past Medical History:  Diagnosis Date  . Abnormal Pap smear of vagina 11/2008   ASCUS-H, 06/10/2009 LGSIL  . Anxiety   . BRCA2 positive   . Family history of ovarian cancer   . Herpes genitalis   . Hyperlipidemia   . Interstitial cystitis   . Osteopenia     Family History  Problem Relation Age of Onset  . Liver cancer Mother 64  . Cervical cancer Mother   . Lung cancer Father 25       smoker  . Skin cancer Father        basal cell  .  Hypertension Father   . Skin cancer Sister 50  . Breast cancer Sister 59       stage 2, triple neg, BRCA pos  . Skin cancer Sister        2016  . Ovarian cancer Maternal Grandmother   . Esophageal cancer Paternal Grandmother   . Pancreatic cancer Paternal Grandfather   . Prostate cancer Paternal Grandfather   . Breast cancer Paternal Aunt     Past Surgical History:  Procedure Laterality Date  . ABDOMINAL HYSTERECTOMY  02/27/2001   TAH, LSO  . BREAST SURGERY Bilateral November 20, 2015   Bilateral prophylactic simple mastectomy  . CARPAL TUNNEL RELEASE  15 years ago   Thumb joint reconstruction  . CESAREAN SECTION    . COLONOSCOPY  08/2015   [place]  . CRYOTHERAPY    . DILATION AND CURETTAGE, DIAGNOSTIC / THERAPEUTIC  05/14/1993  . LAPAROSCOPIC OOPHERECTOMY  Right 2014   RSO  . SIMPLE MASTECTOMY WITH AXILLARY SENTINEL NODE BIOPSY Bilateral 11/20/2015   Procedure: SIMPLE MASTECTOMY;  Surgeon: Robert Bellow, MD;  Location: [facility] ORS;  Service: General;  Laterality: Bilateral;  . TUBAL LIGATION  05/14/1998   Social History   Occupational History  . Occupation: [employer]  Tobacco Use  . Smoking status: Former Smoker    Packs/day: 0.25    Years: 30.00    Pack years: 7.50    Types: Cigarettes    Last attempt to quit: 07/25/2018    Years since quitting: 0.5  . Smokeless tobacco: Never Used  . Tobacco comment: quit 2016  then recently restarted smoking  Substance and Sexual Activity  . Alcohol use: Yes    Alcohol/week: 0.0 standard drinks    Comment: OCC  . Drug use: No  . Sexual activity: Not Currently    Birth control/protection: Surgical

## 2019-03-18 ENCOUNTER — Other Ambulatory Visit: Payer: Self-pay

## 2019-03-18 ENCOUNTER — Encounter (INDEPENDENT_AMBULATORY_CARE_PROVIDER_SITE_OTHER): Payer: Self-pay | Admitting: Family Medicine

## 2019-03-18 ENCOUNTER — Ambulatory Visit (INDEPENDENT_AMBULATORY_CARE_PROVIDER_SITE_OTHER): Payer: PRIVATE HEALTH INSURANCE | Admitting: Family Medicine

## 2019-03-18 VITALS — BP 119/65 | HR 68 | Ht 65.0 in | Wt 134.0 lb

## 2019-03-18 DIAGNOSIS — M25552 Pain in left hip: Secondary | ICD-10-CM | POA: Diagnosis not present

## 2019-03-18 MED ORDER — METHYLPREDNISOLONE ACETATE 40 MG/ML IJ SUSP: 40.0000 mg | Freq: Once | INTRAMUSCULAR | Status: AC

## 2019-03-18 NOTE — Progress Notes (Signed)
Office Visit Note   Patient: Candice Henderson           Date of Birth: 1960-12-30           MRN: [id number] Visit Date: 03/18/2019 Requested by: Zara Chess, NP [address] [place], [place] [address] PCP: Zara Chess, NP  Subjective: Chief Complaint  Patient presents with  . Back Pain    previously seen 02/28/19 with injection, very helpful after 3 days. left side lower back pain with radiating pain to anterior pain to groin. most difficulty bending over, laying on left side. currently not taking any medications for pain. using heating pain. increased pain 3 days. more pain in mid lower back today.     HPI: She is here with persistent left posterior hip pain.  SI injection gave some relief, but now her pain seems to move laterally and is radiating toward the groin area.  No numbness or weakness in her leg.  Pain is best when standing straight up, worse when bending forward.              ROS: No fevers or chills or respiratory symptoms.  All other systems were reviewed and are negative.  Objective: Vital Signs: BP 119/65 (BP Location: Left Arm, Patient Position: Sitting, Cuff Size: Normal)   Pulse 68   Ht '5\' 5"'  (1.651 m)   Wt 134 lb (60.8 kg)   BMI 22.30 kg/m   Physical Exam:  General:  Alert and oriented, in no acute distress. Pulm:  Breathing unlabored. Psy:  Normal mood, congruent affect. Skin: No rash on her skin. Left hip: No further tenderness at the SI joint but she is very tender in the gluteus medius area.  She is also tender in the upper lumbar para spinous muscles to the left of L2-3.  Lower extremity strength and reflexes remain normal.  Imaging: None today.  Assessment & Plan: 1.  Persistent left posterior hip pain, cannot rule out upper lumbar disc protrusion.  Could be myofascial pain. -She wants to try 1 more injection.  If no relief, then lumbar MRI followed by epidural injection if indicated.     Procedures: Left  posterior hip injection: After sterile prep with Betadine, injected 5 cc 1% lidocaine without epinephrine and 1 mg methylprednisolone into the left gluteus medius trigger point.   PMFS History: Patient Active Problem List   Diagnosis Date Noted  . Anxiety   . Hyperlipidemia   . Low back pain 01/22/2018  . Pain in right wrist 01/22/2018  . Chest trauma 01/06/2017  . Swelling of first metatarsophalangeal (MTP) joint of left foot 10/03/2016  . Closed displaced fracture of proximal phalanx of lesser toe of right foot 09/12/2016  . BRCA2 positive 10/07/2015   Past Medical History:  Diagnosis Date  . Abnormal Pap smear of vagina 11/2008   ASCUS-H, 06/10/2009 LGSIL  . Anxiety   . BRCA2 positive   . Family history of ovarian cancer   . Herpes genitalis   . Hyperlipidemia   . Interstitial cystitis   . Osteopenia     Family History  Problem Relation Age of Onset  . Liver cancer Mother 49  . Cervical cancer Mother   . Lung cancer Father 43       smoker  . Skin cancer Father        basal cell  . Hypertension Father   . Skin cancer Sister 76  . Breast cancer Sister 96  stage 2, triple neg, BRCA pos  . Skin cancer Sister        2016  . Ovarian cancer Maternal Grandmother   . Esophageal cancer Paternal Grandmother   . Pancreatic cancer Paternal Grandfather   . Prostate cancer Paternal Grandfather   . Breast cancer Paternal Aunt     Past Surgical History:  Procedure Laterality Date  . ABDOMINAL HYSTERECTOMY  02/27/2001   TAH, LSO  . BREAST SURGERY Bilateral November 20, 2015   Bilateral prophylactic simple mastectomy  . CARPAL TUNNEL RELEASE  15 years ago   Thumb joint reconstruction  . CESAREAN SECTION    . COLONOSCOPY  08/2015   [place]  . CRYOTHERAPY    . DILATION AND CURETTAGE, DIAGNOSTIC / THERAPEUTIC  05/14/1993  . LAPAROSCOPIC OOPHERECTOMY Right 2014   RSO  . SIMPLE MASTECTOMY WITH AXILLARY SENTINEL NODE BIOPSY Bilateral 11/20/2015   Procedure: SIMPLE  MASTECTOMY;  Surgeon: Robert Bellow, MD;  Location: [facility] ORS;  Service: General;  Laterality: Bilateral;  . TUBAL LIGATION  05/14/1998   Social History   Occupational History  . Occupation: [employer]  Tobacco Use  . Smoking status: Former Smoker    Packs/day: 0.25    Years: 30.00    Pack years: 7.50    Types: Cigarettes    Last attempt to quit: 07/25/2018    Years since quitting: 0.6  . Smokeless tobacco: Never Used  . Tobacco comment: quit 2016  then recently restarted smoking  Substance and Sexual Activity  . Alcohol use: Yes    Alcohol/week: 0.0 standard drinks    Comment: OCC  . Drug use: No  . Sexual activity: Not Currently    Birth control/protection: Surgical

## 2019-03-28 ENCOUNTER — Encounter (INDEPENDENT_AMBULATORY_CARE_PROVIDER_SITE_OTHER): Payer: Self-pay | Admitting: Family Medicine

## 2019-03-28 ENCOUNTER — Other Ambulatory Visit: Payer: Self-pay

## 2019-03-28 ENCOUNTER — Ambulatory Visit (INDEPENDENT_AMBULATORY_CARE_PROVIDER_SITE_OTHER): Payer: PRIVATE HEALTH INSURANCE | Admitting: Family Medicine

## 2019-03-28 DIAGNOSIS — M25561 Pain in right knee: Secondary | ICD-10-CM

## 2019-03-28 DIAGNOSIS — M545 Low back pain, unspecified: Secondary | ICD-10-CM

## 2019-03-28 DIAGNOSIS — M25572 Pain in left ankle and joints of left foot: Secondary | ICD-10-CM | POA: Diagnosis not present

## 2019-03-28 MED ORDER — OXYCODONE-ACETAMINOPHEN 5-325 MG PO TABS: 1.0000 | ORAL_TABLET | Freq: Four times a day (QID) | ORAL | 0 refills | Status: DC | PRN

## 2019-03-28 NOTE — Progress Notes (Signed)
Office Visit Note   Patient: Candice Henderson           Date of Birth: 11/03/1961           MRN: [id number] Visit Date: 03/28/2019 Requested by: Zara Chess, NP [address] [place], [place] [id number] PCP: Zara Chess, NP  Subjective: Chief Complaint  Patient presents with  . Left Ankle - Pain    Rolled ankle over today while coming down steps in her home, and fell, hitting right knee and "jarring my lower back."  . Right Knee - Pain  . Lower Back - Pain    HPI: She is here with left ankle pain, right knee and low back pain.  This morning she came down some steps, inverted her left ankle and fell hitting her right knee against the ground and jarring her lower back.  It took about 10 minutes before she could stand up, she has not been able to bear full weight on her left foot.  Pain and swelling on the dorsal lateral aspect.  She sustained an abrasion on the anterior lateral right knee but is able to bear full weight on it, flex and extend it without any significant pain.  Her low back was doing better after injection, but this injury seems to have aggravated it.              ROS: No fevers or chills or respiratory symptoms.  All other systems were reviewed and are negative.  Objective: Vital Signs: There were no vitals taken for this visit.  Physical Exam:  General:  Alert and oriented, in no acute distress. Pulm:  Breathing unlabored. Psy:  Normal mood, congruent affect. Skin: Abrasion on her right knee is no longer actively bleeding. Right knee: No tenderness to palpation around the abrasion.  No joint effusion. Left foot: No tenderness over the malleoli or the proximal fifth metatarsal.  She is very tender to palpation near the cuboid and she has some soft tissue swelling there.  Unable to assess ligament stability due to pain.  Imaging: Limited diagnostic ultrasound left foot: I believe she has a nondisplaced fracture of the cuboid with  cortical disruption and small joint effusion near the TMT joint.  She also has a probable partial tear of the ATFL.  Assessment & Plan: 1.  Status post fall with left foot pain, possible nondisplaced cuboid fracture. -Fracture boot, weightbearing as tolerated.  Follow-up in 2 weeks for recheck.  If still in a lot of pain we will take three-view foot x-rays.  2.  Right knee abrasion status post fall  3.  Aggravation of low back pain status post fall - MRI scan if symptoms do not improve.     Procedures: No procedures performed  No notes on file     PMFS History: Patient Active Problem List   Diagnosis Date Noted  . Anxiety   . Hyperlipidemia   . Low back pain 01/22/2018  . Pain in right wrist 01/22/2018  . Chest trauma 01/06/2017  . Swelling of first metatarsophalangeal (MTP) joint of left foot 10/03/2016  . Closed displaced fracture of proximal phalanx of lesser toe of right foot 09/12/2016  . BRCA2 positive 10/07/2015   Past Medical History:  Diagnosis Date  . Abnormal Pap smear of vagina 11/2008   ASCUS-H, 06/10/2009 LGSIL  . Anxiety   . BRCA2 positive   . Family history of ovarian cancer   . Herpes genitalis   . Hyperlipidemia   .  Interstitial cystitis   . Osteopenia     Family History  Problem Relation Age of Onset  . Liver cancer Mother 16  . Cervical cancer Mother   . Lung cancer Father 66       smoker  . Skin cancer Father        basal cell  . Hypertension Father   . Skin cancer Sister 64  . Breast cancer Sister 67       stage 2, triple neg, BRCA pos  . Skin cancer Sister        2016  . Ovarian cancer Maternal Grandmother   . Esophageal cancer Paternal Grandmother   . Pancreatic cancer Paternal Grandfather   . Prostate cancer Paternal Grandfather   . Breast cancer Paternal Aunt     Past Surgical History:  Procedure Laterality Date  . ABDOMINAL HYSTERECTOMY  02/27/2001   TAH, LSO  . BREAST SURGERY Bilateral November 20, 2015   Bilateral  prophylactic simple mastectomy  . CARPAL TUNNEL RELEASE  15 years ago   Thumb joint reconstruction  . CESAREAN SECTION    . COLONOSCOPY  08/2015   [place]  . CRYOTHERAPY    . DILATION AND CURETTAGE, DIAGNOSTIC / THERAPEUTIC  05/14/1993  . LAPAROSCOPIC OOPHERECTOMY Right 2014   RSO  . SIMPLE MASTECTOMY WITH AXILLARY SENTINEL NODE BIOPSY Bilateral 11/20/2015   Procedure: SIMPLE MASTECTOMY;  Surgeon: Robert Bellow, MD;  Location: [facility] ORS;  Service: General;  Laterality: Bilateral;  . TUBAL LIGATION  05/14/1998   Social History   Occupational History  . Occupation: [employer]  Tobacco Use  . Smoking status: Former Smoker    Packs/day: 0.25    Years: 30.00    Pack years: 7.50    Types: Cigarettes    Last attempt to quit: 07/25/2018    Years since quitting: 0.6  . Smokeless tobacco: Never Used  . Tobacco comment: quit 2016  then recently restarted smoking  Substance and Sexual Activity  . Alcohol use: Yes    Alcohol/week: 0.0 standard drinks    Comment: OCC  . Drug use: No  . Sexual activity: Not Currently    Birth control/protection: Surgical

## 2019-04-01 ENCOUNTER — Telehealth (INDEPENDENT_AMBULATORY_CARE_PROVIDER_SITE_OTHER): Payer: Self-pay | Admitting: Family Medicine

## 2019-04-01 DIAGNOSIS — M545 Low back pain, unspecified: Secondary | ICD-10-CM

## 2019-04-01 NOTE — Telephone Encounter (Signed)
Lumbar

## 2019-04-01 NOTE — Telephone Encounter (Signed)
Ordered

## 2019-04-01 NOTE — Telephone Encounter (Signed)
I called and advised the patient she will be getting a call from the imaging center.

## 2019-04-01 NOTE — Telephone Encounter (Signed)
Patient lmom requesting a order for mri.

## 2019-04-08 ENCOUNTER — Telehealth (INDEPENDENT_AMBULATORY_CARE_PROVIDER_SITE_OTHER): Payer: Self-pay | Admitting: Family Medicine

## 2019-04-08 NOTE — Telephone Encounter (Signed)
Pt called stating she can't wait till June 2nd for her MRI she is in extreme pain.   The pain medication has taken the edge off. Pt is wondering if she can get her MRI sooner ?

## 2019-04-09 NOTE — Telephone Encounter (Signed)
Pt is scheduled with  Imaging on Wed 04/10/19 at 1:30pm, order has been faxed and pt aware of appt

## 2019-04-09 NOTE — Telephone Encounter (Signed)
sw pt and she would like to go to either [facility] or [facility], which ever one would see her soonest

## 2019-04-11 ENCOUNTER — Telehealth (INDEPENDENT_AMBULATORY_CARE_PROVIDER_SITE_OTHER): Payer: Self-pay | Admitting: Family Medicine

## 2019-04-11 NOTE — Telephone Encounter (Signed)
Pt called saying she was in extreme pain and needed some pain medication.  Pt stated she was taking oxycodone that she was prescribed 2 weeks ago for her foot but it was only 20 pills and she is out so she has nothing else to take for her back.   Pt was scheduled yesterday at [facility] location but there was an issue with her order and they had to rescheule pt for Mon May 4, pt states she is hurting bad and would like to have some pain meds to get her through until her MRI appt. Pt is requesting stronger RX of oxycodone.   Pt is requesting someone to call her ASAP in hopes to get meds today.

## 2019-04-11 NOTE — Telephone Encounter (Signed)
Pt called saying she was in extreme pain and needed some pain medication.  Pt stated she was taking oxycodone that she was prescribed 2 weeks ago for her foot but it was only 20 pills and she is out so she has nothing else to take for her back.

## 2019-04-11 NOTE — Telephone Encounter (Signed)
pls advise

## 2019-04-12 MED ORDER — OXYCODONE-ACETAMINOPHEN 7.5-325 MG PO TABS: 1.0000 | ORAL_TABLET | Freq: Four times a day (QID) | ORAL | 0 refills | Status: AC | PRN

## 2019-04-12 NOTE — Telephone Encounter (Signed)
Rx sent 

## 2019-04-16 ENCOUNTER — Telehealth: Payer: Self-pay | Admitting: Family Medicine

## 2019-04-16 DIAGNOSIS — M545 Low back pain, unspecified: Secondary | ICD-10-CM

## 2019-04-16 NOTE — Addendum Note (Signed)
Addended by: Lillia Carmel on: 04/16/2019 01:00 PM   Modules accepted: Orders

## 2019-04-16 NOTE — Telephone Encounter (Signed)
Orders placed.

## 2019-04-16 NOTE — Telephone Encounter (Signed)
I called and advised the patient of the MRI results. She would like to try PT - anywhere in [place] is best for her.

## 2019-04-16 NOTE — Telephone Encounter (Signed)
MRI report from [facility] on Apr 15, 2019 reveals the following:  There are no ruptured disks or pinched nerves.  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  No indication for surgery.  Physical therapy or chiropractic would be considerations.

## 2019-05-14 ENCOUNTER — Other Ambulatory Visit: Payer: BLUE CROSS/BLUE SHIELD

## 2019-11-12 ENCOUNTER — Other Ambulatory Visit: Payer: Self-pay

## 2019-11-12 DIAGNOSIS — Z20822 Contact with and (suspected) exposure to covid-19: Secondary | ICD-10-CM

## 2019-11-14 LAB — NOVEL CORONAVIRUS, NAA: SARS-CoV-2, NAA: NOT DETECTED

## 2019-12-02 ENCOUNTER — Ambulatory Visit: Payer: 59 | Attending: Internal Medicine

## 2019-12-02 DIAGNOSIS — Z20822 Contact with and (suspected) exposure to covid-19: Secondary | ICD-10-CM

## 2019-12-03 LAB — NOVEL CORONAVIRUS, NAA: SARS-CoV-2, NAA: NOT DETECTED

## 2022-03-15 ENCOUNTER — Other Ambulatory Visit: Payer: Self-pay | Admitting: Neurosurgery

## 2022-03-15 DIAGNOSIS — M5416 Radiculopathy, lumbar region: Secondary | ICD-10-CM

## 2022-03-22 ENCOUNTER — Ambulatory Visit
Admission: RE | Admit: 2022-03-22 | Discharge: 2022-03-22 | Disposition: A | Discharge status: Home / Self Care | Payer: Self-pay | Source: Ambulatory Visit | Attending: Neurosurgery | Admitting: Neurosurgery

## 2022-03-22 DIAGNOSIS — M5416 Radiculopathy, lumbar region: Secondary | ICD-10-CM

## 2022-09-28 ENCOUNTER — Other Ambulatory Visit: Payer: Self-pay | Admitting: Nurse Practitioner

## 2022-09-28 DIAGNOSIS — Z780 Asymptomatic menopausal state: Secondary | ICD-10-CM

## 2022-09-28 DIAGNOSIS — Z13820 Encounter for screening for osteoporosis: Secondary | ICD-10-CM

## 2022-10-21 ENCOUNTER — Other Ambulatory Visit: Payer: Self-pay | Admitting: Nurse Practitioner

## 2022-10-21 DIAGNOSIS — Z87891 Personal history of nicotine dependence: Secondary | ICD-10-CM

## 2022-10-21 DIAGNOSIS — Z122 Encounter for screening for malignant neoplasm of respiratory organs: Secondary | ICD-10-CM

## 2022-11-01 ENCOUNTER — Ambulatory Visit
Admission: RE | Admit: 2022-11-01 | Discharge: 2022-11-01 | Disposition: A | Discharge status: Home / Self Care | Payer: BC Managed Care – PPO | Source: Ambulatory Visit | Attending: Nurse Practitioner | Admitting: Nurse Practitioner

## 2022-11-01 DIAGNOSIS — Z87891 Personal history of nicotine dependence: Secondary | ICD-10-CM | POA: Diagnosis present

## 2022-11-01 DIAGNOSIS — Z122 Encounter for screening for malignant neoplasm of respiratory organs: Secondary | ICD-10-CM | POA: Diagnosis present

## 2022-11-09 ENCOUNTER — Other Ambulatory Visit: Payer: Self-pay | Admitting: Nurse Practitioner

## 2022-11-09 DIAGNOSIS — Z122 Encounter for screening for malignant neoplasm of respiratory organs: Secondary | ICD-10-CM

## 2022-11-09 DIAGNOSIS — Z87891 Personal history of nicotine dependence: Secondary | ICD-10-CM

## 2022-12-07 ENCOUNTER — Ambulatory Visit
Admission: RE | Admit: 2022-12-07 | Discharge: 2022-12-07 | Disposition: A | Discharge status: Home / Self Care | Payer: BC Managed Care – PPO | Source: Ambulatory Visit | Attending: Nurse Practitioner | Admitting: Nurse Practitioner

## 2022-12-07 DIAGNOSIS — Z13820 Encounter for screening for osteoporosis: Secondary | ICD-10-CM | POA: Diagnosis present

## 2022-12-07 DIAGNOSIS — Z780 Asymptomatic menopausal state: Secondary | ICD-10-CM | POA: Insufficient documentation

## 2023-04-19 ENCOUNTER — Other Ambulatory Visit: Payer: Self-pay | Admitting: Physical Medicine & Rehabilitation

## 2023-04-19 DIAGNOSIS — G8929 Other chronic pain: Secondary | ICD-10-CM

## 2023-04-24 ENCOUNTER — Ambulatory Visit
Admission: RE | Admit: 2023-04-24 | Discharge: 2023-04-24 | Disposition: A | Discharge status: Home / Self Care | Payer: BC Managed Care – PPO | Source: Ambulatory Visit | Attending: Physical Medicine & Rehabilitation | Admitting: Physical Medicine & Rehabilitation

## 2023-04-24 DIAGNOSIS — M5442 Lumbago with sciatica, left side: Secondary | ICD-10-CM | POA: Insufficient documentation

## 2023-04-24 DIAGNOSIS — G8929 Other chronic pain: Secondary | ICD-10-CM | POA: Insufficient documentation

## 2023-08-10 ENCOUNTER — Other Ambulatory Visit: Payer: Self-pay | Admitting: Nurse Practitioner

## 2023-08-10 DIAGNOSIS — R102 Pelvic and perineal pain: Secondary | ICD-10-CM

## 2023-08-10 DIAGNOSIS — R1012 Left upper quadrant pain: Secondary | ICD-10-CM

## 2023-08-17 ENCOUNTER — Ambulatory Visit: Admission: RE | Admit: 2023-08-17 | Payer: BC Managed Care – PPO | Source: Ambulatory Visit

## 2023-08-22 ENCOUNTER — Ambulatory Visit
Admission: RE | Admit: 2023-08-22 | Discharge: 2023-08-22 | Disposition: A | Discharge status: Home / Self Care | Payer: BC Managed Care – PPO | Source: Ambulatory Visit | Attending: Nurse Practitioner | Admitting: Nurse Practitioner

## 2023-08-22 DIAGNOSIS — R1012 Left upper quadrant pain: Secondary | ICD-10-CM | POA: Insufficient documentation

## 2023-08-22 DIAGNOSIS — R102 Pelvic and perineal pain: Secondary | ICD-10-CM | POA: Diagnosis present

## 2023-08-22 MED ORDER — IOHEXOL 300 MG/ML  SOLN
100.0000 mL | Freq: Once | INTRAMUSCULAR | Status: AC | PRN
  Administered 2023-08-22: 100 mL via INTRAVENOUS

## 2023-10-13 ENCOUNTER — Other Ambulatory Visit: Payer: Self-pay | Admitting: Nurse Practitioner

## 2023-10-13 DIAGNOSIS — Z87891 Personal history of nicotine dependence: Secondary | ICD-10-CM

## 2023-11-06 ENCOUNTER — Ambulatory Visit
Admission: RE | Admit: 2023-11-06 | Discharge: 2023-11-06 | Disposition: A | Discharge status: Home / Self Care | Payer: BC Managed Care – PPO | Source: Ambulatory Visit | Attending: Nurse Practitioner | Admitting: Nurse Practitioner

## 2023-11-06 DIAGNOSIS — Z87891 Personal history of nicotine dependence: Secondary | ICD-10-CM | POA: Diagnosis present

## 2024-05-14 ENCOUNTER — Other Ambulatory Visit: Payer: Self-pay | Admitting: Nurse Practitioner

## 2024-05-14 DIAGNOSIS — Z87891 Personal history of nicotine dependence: Secondary | ICD-10-CM

## 2024-05-14 DIAGNOSIS — Z122 Encounter for screening for malignant neoplasm of respiratory organs: Secondary | ICD-10-CM

## 2024-05-23 ENCOUNTER — Ambulatory Visit: Admission: RE | Admit: 2024-05-23 | Source: Ambulatory Visit

## 2024-05-27 ENCOUNTER — Ambulatory Visit
Admission: RE | Admit: 2024-05-27 | Discharge: 2024-05-27 | Disposition: A | Discharge status: Home / Self Care | Source: Ambulatory Visit | Attending: Nurse Practitioner | Admitting: Nurse Practitioner

## 2024-05-27 DIAGNOSIS — Z122 Encounter for screening for malignant neoplasm of respiratory organs: Secondary | ICD-10-CM | POA: Diagnosis present

## 2024-05-27 DIAGNOSIS — Z87891 Personal history of nicotine dependence: Secondary | ICD-10-CM | POA: Diagnosis present

## 2024-11-19 ENCOUNTER — Ambulatory Visit: Admitting: Podiatry

## 2024-11-19 DIAGNOSIS — M7661 Achilles tendinitis, right leg: Secondary | ICD-10-CM | POA: Diagnosis not present

## 2024-11-19 NOTE — Progress Notes (Unsigned)
 Subjective:  Patient ID: Candice Henderson, female    DOB: 05-28-1961,  MRN: [id number]  Chief Complaint  Patient presents with   Heel Spurs    NP- Bilateral heels are very painful, having to walk on tip of toes  under 4th toe R foot getting sharp pain . She points to the back of her ankle-achillis tendon area     63 y.o. female presents with the above complaint.  Patient presents with bilateral Achilles tendinitis right much worse than left side.  She wanted to get it evaluated has not seen and was prior to seeing me.  She states it hurts in the back of her heels.  Is getting worse walking on her feet she has to walk on tippy toes pain scale 7 out of 10.  She would like to discuss treatment options for this.   Review of Systems: Negative except as noted in the HPI. Denies N/V/F/Ch.  Past Medical History:  Diagnosis Date   Abnormal Pap smear of vagina 11/2008   ASCUS-H, 06/10/2009 LGSIL   Anxiety    BRCA2 positive    Family history of ovarian cancer    Herpes genitalis    Hyperlipidemia    Interstitial cystitis    Osteopenia    Current Medications[1]  Tobacco Use History[2]  Allergies[3] Objective:  There were no vitals filed for this visit. There is no height or weight on file to calculate BMI. Constitutional Well developed. Well nourished.  Vascular Dorsalis pedis pulses palpable bilaterally. Posterior tibial pulses palpable bilaterally. Capillary refill normal to all digits.  No cyanosis or clubbing noted. Pedal hair growth normal.  Neurologic Normal speech. Oriented to person, place, and time. Epicritic sensation to light touch grossly present bilaterally.  Dermatologic Nails well groomed and normal in appearance. No open wounds. No skin lesions.  Orthopedic: Bilateral Achilles tendinitis right greater than left pain with dorsiflexion of the ankle joint no pain with plantarflexion of the ankle joint palpable Haglund's deformity noted.  Positive Silfverskiold test  noted gastrocnemius equinus.  No pain peroneal tendon ATFL ligament posterior tibial tendon   Radiographs: None Assessment:   1. Right Achilles tendinitis    Plan:  Patient was evaluated and treated and all questions answered.  Right Achilles tendinitis - All questions and concerns were discussed with the patient extensive due to given the amount of pain that she is experiencing she would benefit from cam boot immobilization patient agrees with plan to proceed with cam boot.  She is not able to tolerate the Tri-Lock ankle brace therefore we will hold off on treating the left side for now. - We can move the cam boot to the left side during next visit. - If there is no improvement we will discuss steroid injection  No follow-ups on file.     [1]  Current Outpatient Medications:    Cholecalciferol (VITAMIN D) 2000 units CAPS, Take by mouth., Disp: , Rfl:    citalopram (CELEXA) 40 MG tablet, TK 1 T PO D, Disp: , Rfl: 1   clonazePAM (KLONOPIN) 1 MG tablet, TK 1 T PO BID PRA, Disp: , Rfl: 0   diclofenac sodium (VOLTAREN) 1 % GEL, APP ONTO THE SKIN QID PRN, Disp: , Rfl:    fluticasone (FLONASE) 50 MCG/ACT nasal spray, SHAKE LQ AND U 1 SPR IEN D, Disp: , Rfl:    ibuprofen  (ADVIL ,MOTRIN ) 800 MG tablet, Take 800 mg by mouth 3 (three) times daily., Disp: , Rfl:    oxyCODONE -acetaminophen  (PERCOCET) 7.5-325 MG  tablet, Take 1 tablet by mouth every 6 (six) hours as needed for severe pain., Disp: 20 tablet, Rfl: 0   pravastatin (PRAVACHOL) 40 MG tablet, TK 1 T PO Q EVENING, Disp: , Rfl: 1   tiZANidine  (ZANAFLEX ) 2 MG tablet, Take 1-2 tablets (2-4 mg total) by mouth at bedtime as needed for muscle spasms., Disp: 60 tablet, Rfl: 1  Current Facility-Administered Medications:    methylPREDNISolone  acetate (DEPO-MEDROL ) injection 40 mg, 40 mg, Intra-articular, Once, Hilts, Michael, MD   methylPREDNISolone  acetate (DEPO-MEDROL ) injection 40 mg, 40 mg, Intra-articular, Once, Hilts, Michael, MD [2]   Social History Tobacco Use  Smoking Status Former   Current packs/day: 0.00   Average packs/day: 0.3 packs/day for 30.0 years (7.5 ttl pk-yrs)   Types: Cigarettes   Start date: 07/25/1988   Quit date: 07/25/2018   Years since quitting: 6.3  Smokeless Tobacco Never  Tobacco Comments   quit 2016  then recently restarted smoking  [3]  Allergies Allergen Reactions   Amoxicillin Anaphylaxis and Palpitations    Rash and Vomiting hot flash, flushed, diarrhea, vomiting , heart racing, diaphoresis    Codeine Nausea And Vomiting and Rash   Macrobid [Nitrofurantoin] Nausea And Vomiting and Rash

## 2024-11-20 ENCOUNTER — Telehealth: Payer: Self-pay

## 2024-11-20 NOTE — Telephone Encounter (Signed)
 Patient called and came by the office. She returned the trilock brace [phone]) - it was too painful on the sides of her ankle. Just need to make sure she is not billed for it. Also the short boot was rubbing/banging against her shin every step, even with extra padding in the front. We switched out the short boot for a tall boot. This fit much better and did not rub or hurt her shin. She was very happy with the fit. thanks

## 2024-12-17 ENCOUNTER — Ambulatory Visit: Payer: Self-pay | Admitting: Podiatry
